# Patient Record
Sex: Female | Born: 1970
Health system: Southern US, Community
[De-identification: ages and names within clinical notes are randomized; demographics above are authoritative.]

## PROBLEM LIST (undated history)

## (undated) DIAGNOSIS — J301 Allergic rhinitis due to pollen: Secondary | ICD-10-CM

## (undated) DIAGNOSIS — T7840XA Allergy, unspecified, initial encounter: Secondary | ICD-10-CM

## (undated) DIAGNOSIS — B019 Varicella without complication: Secondary | ICD-10-CM

## (undated) DIAGNOSIS — E049 Nontoxic goiter, unspecified: Secondary | ICD-10-CM

## (undated) HISTORY — DX: Varicella without complication: B01.9

## (undated) HISTORY — DX: Nontoxic goiter, unspecified: E04.9

## (undated) HISTORY — DX: Allergic rhinitis due to pollen: J30.1

## (undated) HISTORY — DX: Allergy, unspecified, initial encounter: T78.40XA

---

## 1999-12-25 ENCOUNTER — Other Ambulatory Visit: Admission: RE | Admit: 1999-12-25 | Discharge: 1999-12-25 | Payer: Self-pay | Admitting: Obstetrics and Gynecology

## 2000-04-08 ENCOUNTER — Encounter: Payer: Self-pay | Admitting: Obstetrics and Gynecology

## 2000-04-08 ENCOUNTER — Ambulatory Visit (HOSPITAL_COMMUNITY): Admission: RE | Admit: 2000-04-08 | Discharge: 2000-04-08 | Payer: Self-pay | Admitting: Obstetrics and Gynecology

## 2000-08-12 ENCOUNTER — Inpatient Hospital Stay (HOSPITAL_COMMUNITY): Admission: AD | Admit: 2000-08-12 | Discharge: 2000-08-12 | Payer: Self-pay | Admitting: Obstetrics and Gynecology

## 2000-08-31 ENCOUNTER — Inpatient Hospital Stay (HOSPITAL_COMMUNITY): Admission: AD | Admit: 2000-08-31 | Discharge: 2000-09-02 | Payer: Self-pay | Admitting: Obstetrics and Gynecology

## 2000-08-31 ENCOUNTER — Encounter (INDEPENDENT_AMBULATORY_CARE_PROVIDER_SITE_OTHER): Payer: Self-pay | Admitting: Specialist

## 2001-01-04 ENCOUNTER — Other Ambulatory Visit: Admission: RE | Admit: 2001-01-04 | Discharge: 2001-01-04 | Payer: Self-pay | Admitting: Obstetrics and Gynecology

## 2001-06-11 ENCOUNTER — Encounter: Payer: Self-pay | Admitting: Emergency Medicine

## 2001-06-11 ENCOUNTER — Emergency Department (HOSPITAL_COMMUNITY): Admission: EM | Admit: 2001-06-11 | Discharge: 2001-06-11 | Payer: Self-pay | Admitting: Emergency Medicine

## 2001-08-09 ENCOUNTER — Other Ambulatory Visit: Admission: RE | Admit: 2001-08-09 | Discharge: 2001-08-09 | Payer: Self-pay | Admitting: Obstetrics and Gynecology

## 2001-09-16 ENCOUNTER — Other Ambulatory Visit: Admission: RE | Admit: 2001-09-16 | Discharge: 2001-09-16 | Payer: Self-pay | Admitting: Obstetrics and Gynecology

## 2001-12-17 ENCOUNTER — Other Ambulatory Visit: Admission: RE | Admit: 2001-12-17 | Discharge: 2001-12-17 | Payer: Self-pay | Admitting: Obstetrics and Gynecology

## 2003-03-06 ENCOUNTER — Other Ambulatory Visit: Admission: RE | Admit: 2003-03-06 | Discharge: 2003-03-06 | Payer: Self-pay | Admitting: Obstetrics and Gynecology

## 2003-08-30 ENCOUNTER — Inpatient Hospital Stay (HOSPITAL_COMMUNITY): Admission: AD | Admit: 2003-08-30 | Discharge: 2003-08-30 | Payer: Self-pay | Admitting: Obstetrics and Gynecology

## 2003-08-31 ENCOUNTER — Inpatient Hospital Stay (HOSPITAL_COMMUNITY): Admission: AD | Admit: 2003-08-31 | Discharge: 2003-09-03 | Payer: Self-pay | Admitting: Obstetrics and Gynecology

## 2003-09-04 ENCOUNTER — Encounter: Admission: RE | Admit: 2003-09-04 | Discharge: 2003-10-04 | Payer: Self-pay | Admitting: Obstetrics and Gynecology

## 2003-10-05 ENCOUNTER — Encounter: Admission: RE | Admit: 2003-10-05 | Discharge: 2003-11-04 | Payer: Self-pay | Admitting: Obstetrics and Gynecology

## 2004-12-19 ENCOUNTER — Other Ambulatory Visit: Admission: RE | Admit: 2004-12-19 | Discharge: 2004-12-19 | Payer: Self-pay | Admitting: Obstetrics and Gynecology

## 2005-12-24 ENCOUNTER — Other Ambulatory Visit: Admission: RE | Admit: 2005-12-24 | Discharge: 2005-12-24 | Payer: Self-pay | Admitting: Obstetrics and Gynecology

## 2007-03-10 ENCOUNTER — Encounter: Admission: RE | Admit: 2007-03-10 | Discharge: 2007-03-10 | Payer: Self-pay | Admitting: Obstetrics and Gynecology

## 2007-10-30 ENCOUNTER — Inpatient Hospital Stay (HOSPITAL_COMMUNITY): Admission: AD | Admit: 2007-10-30 | Discharge: 2007-10-30 | Payer: Self-pay | Admitting: Obstetrics and Gynecology

## 2008-04-08 ENCOUNTER — Inpatient Hospital Stay (HOSPITAL_COMMUNITY): Admission: AD | Admit: 2008-04-08 | Discharge: 2008-04-10 | Payer: Self-pay | Admitting: Obstetrics and Gynecology

## 2009-09-20 ENCOUNTER — Emergency Department (HOSPITAL_BASED_OUTPATIENT_CLINIC_OR_DEPARTMENT_OTHER): Admission: EM | Admit: 2009-09-20 | Discharge: 2009-09-20 | Payer: Self-pay | Admitting: Emergency Medicine

## 2010-09-10 NOTE — Discharge Summary (Signed)
NAME:  Sara Reynolds, Sara Reynolds             ACCOUNT NO.:  [id number]   MEDICAL RECORD NO.:  [id number]          PATIENT TYPE:  INP   LOCATION:  9147                          FACILITY:  [facility]   PHYSICIAN:  Malachi Pro. Ambrose Mantle, M.D. DATE OF BIRTH:  1970/09/03   DATE OF ADMISSION:  04/08/2008  DATE OF DISCHARGE:  04/10/2008                               DISCHARGE SUMMARY   A 40 year old white married female para 1-1-0-2, gravida 3, [facility] April 22, 2008, by dates admitted in early labor.  Blood group and type O  positive.  Negative antibody.  Nonreactive serology.  Rubella immune.  Hepatitis B surface antigen negative.  HIV negative.  GC and chlamydia  negative.  First trimester screen negative.  One-hour Glucola 107.  Group B strep positive.  Last period July 17, 2007.  [facility] April 22, 2008.  Vaginal ultrasound September 29, 2007.  Crown-rump length 3.3 cm, 10  weeks 1 day, [facility] April 22, 2008.  Ultrasound on November 29, 2007,  average gestational age [redacted] weeks 1 day, [facility] April 23, 2008.  The  patient had bleeding at 15 weeks'.  Normal ultrasound.  Prenatal care  otherwise uncomplicated.  She began contracting on the night of  admission.  Cervix was 3 cm on exam in the Maternity Admission Unit.  It  had been 2 cm in the office.   PAST MEDICAL HISTORY:  No known allergies.   OPERATION:  C-section in 2005.   ILLNESSES:  None.   ALCOHOL, TOBACCO, AND DRUGS:  None.   FAMILY HISTORY:  Father with high blood pressure and thyroid problem.  Mother with thyroid problem.  Paternal grandfather CVA.  Maternal  grandfather heart disease.  Paternal grandparents diabetes.   OBSTETRIC HISTORY:  In May 2002, she delivered a 6 pounds 13 ounces female  vaginally at 39 weeks.  In May 2005, a C-section for frank breech at 36+  weeks.   HOSPITAL COURSE:  On admission, her vital signs were normal.  Heart and  lungs were normal.  The abdomen was soft and term sized.  Fetal heart  tones were normal.  Cervix was 3-4  cm, 70% vertex at -2.  Artificial  rupture of membranes showed clear fluid with blood.  The patient was  begun on penicillin.  She wanted a vaginal birth after cesarean section.  She requested an epidural.  At 9:05 p.m., the cervix was 4-5 cm.  The  patient agreed to Pitocin after the epidural.  At 10 p.m., the cervix  was 4 cm, 90% vertex at -2.  Pitocin was begun on low-dose.  She became  fully dilated with the vertex ROA.  She pushed well and delivered  spontaneously ROA over a second-degree midline laceration, a living  female infant 7 pounds 2 ounces, Apgars of 9 at one and 9 at five  minutes.  Placenta was intact.  C-section scar was intact.  Rectal  negative.  Second-degree midline laceration repaired with 3-0 Vicryl.  Blood loss about 400 mL.  Postpartum, the patient did well and was  discharged on the second postpartum day.  Initial hemoglobin was  9.6,  hematocrit 29.8, white count 7600, platelet count 192,000.  RPR  nonreactive.  Followup hemoglobin 8.2.   FINAL DIAGNOSES:  Intrauterine pregnancy at 38 weeks delivered ROA,  positive group B strep, prior C-section.   OPERATION:  Spontaneous vaginal delivery, VBAC, second-degree midline  laceration repair.   FINAL CONDITION:  Improved.   Instructions include regular discharge instruction booklet.   Percocet 5/325, 30 tablets 1 every 4-6 hours as needed for pain, Motrin  600 mg 30 tablets 1 every 6 hours as needed for pain, take ferrous  sulfate 325 mg twice daily.   Return to the office in 6 weeks for followup examination.      Malachi Pro. Ambrose Mantle, M.D.  Electronically Signed     TFH/MEDQ  D:  04/10/2008  T:  04/10/2008  Job:  [id number]

## 2010-09-13 NOTE — Discharge Summary (Signed)
NAME:  Sara Reynolds, Sara Reynolds                       ACCOUNT NO.:  [id number]   MEDICAL RECORD NO.:  [id number]                   PATIENT TYPE:  INP   LOCATION:  9132                                 [facility]  [facility]   PHYSICIAN:  Zenaida Niece, M.D.             DATE OF BIRTH:  [birth year]   DATE OF ADMISSION:  08/31/2003  DATE OF DISCHARGE:  09/03/2003                                 DISCHARGE SUMMARY   ADMISSION DIAGNOSES:  Intrauterine pregnancy at 36 plus weeks, breech and  preterm labor.   DISCHARGE DIAGNOSES:  Intrauterine pregnancy at 36 plus weeks, breech and  preterm labor.   PROCEDURE:  On Aug 31, 2003 she had a primary low transverse cesarean section  without extensions.   HISTORY OF PRESENT ILLNESS:  Briefly, this is a 40 year old white female  gravida 2, para 1-0-0-1 with an estimated gestational age of 36+ weeks who  presented with irregular contractions. Prenatal care complicated by known  breech presentation and she had an attempted external cephalic version on  Aug 30, 2003.   PRENATAL LABORATORY DATA:  Blood type O+ with a negative antibody screen.  RPR nonreactive. Rubella immune. Hepatitis B surface antigen. Negative HIV.  Negative gonorrhea and Chlamydia. Negative group B strep is not on the  chart.   PAST OBSTETRIC HISTORY:  In 2002, vaginal delivery at term, 6 pounds 13  ounces without complications.   PHYSICAL EXAMINATION:  VITAL SIGNS:  Afebrile with stable vital signs.  ABDOMEN:  Gravid and nontender. Cervix is 5, complete, and zero with a  breech presentation.   HOSPITAL COURSE:  The patient was admitted and taken to the operating room  for a cesarean section. Dr. Senaida Ores performed the cesarean section under  spinal anesthesia with an estimated blood loss of 800 cc. This was a viable  female infant with Apgars of 7 and 9 that weighed 6 pounds 5 ounces and was in  the frank breech presentation. Postoperatively, she had no complications.  Pre-delivery  hemoglobin was 11.3. Post-delivery was 9.9. On the morning of  postoperative day 3, she was felt to be stable enough for discharge home. At  that time, her incision was healing well and staples were removed and steri-  strips applied. She is given our discharge pamphlet.   DIET:  Regular diet.   ACTIVITY:  Pelvic rest, no strenuous activity.   FOLLOW UP:  In approximately 2 weeks for incision check.   DISCHARGE MEDICATIONS:  1. Percocet #30 1 to 2 p.o. q. 4 to 6 hours p.r.n. pain.  2. Over-the-counter Ibuprofen as needed.                                              Zenaida Niece, M.D.   TDM/MEDQ  D:  09/03/2003  T:  09/03/2003  Job:  [id number]

## 2010-09-13 NOTE — Discharge Summary (Signed)
[facility]  Patient:    Sara Reynolds, Sara Reynolds                    MRN: [id number] Adm. Date:  [id number] Disc. Date: 09/02/00 Attending:  Malon Kindle                           Discharge Summary                                A 40 year old white married female para 0, gravida 1, last period Aug 29, 2000, [facility] Sep 05, 2000 by dates and Sep 03, 2000 by ultrasound admitted in active labor.  Blood group and type O+ with a negative antibody.  Nonreactive serology.  Rubella equivocal.  Hepatitis B surface antigen negative.  HIV negative.  GC and chlamydia negative.  Triple screen normal.  One hour glucola 121.  Group B strep negative.  Vaginal ultrasound on February 05, 2000:  Crown rump length 2.8 cm, 9 weeks 5 days, [facility] Sep 03, 2000.  Repeat ultrasound April 08, 2000:  Average gestational age [redacted] weeks 6 days, [facility] Sep 03, 2000.  The patient was placed on iron Heaven 10, 2002 for a hemoglobin of 10.  Prenatal course was otherwise uncomplicated.  Patient came to maternity admission unit at 8 cm dilatation and was fully dilated shortly after arrival in L&D, but declined to push.  ALLERGIES:                    No known drug allergies.  PAST SURGICAL HISTORY:        None.  PAST MEDICAL HISTORY:         Usual childhood diseases.  FAMILY HISTORY:               Father with high blood pressure, borderline diabetes.  Mother with thyroid dysfunction.  SOCIAL HISTORY:               Alcohol, tobacco, and drugs:  None.  PHYSICAL EXAMINATION  VITAL SIGNS:                  Normal.  ABDOMEN:                      Soft.  Fundal height had been 37 cm on Lovelle 24, 2002.  PELVIC:                       On my first examination the cervix was 10 cm, vertex at a +2 station.                                The patient brought the baby down well and delivered spontaneously OA over a midline episiotomy by Dr. Ambrose Mantle under local block a living female infant 6 pounds 13  ounces with Apgars of 8 at one and 9 at five minutes.  Episiotomy was done because there was fetal bradycardia into the 60s.  Placenta was intact.  Uterus normal.  Rectal was negative.  Midline episiotomy repaired with 3-0 Dexon.  Midline episiotomy appeared normal after repair.  Blood loss about 400 cc.  Postpartum the patient did quite well and was discharged on the second postpartum day.  Initial hemoglobin 10.5, hematocrit 32.1, white  count 9900, platelet count 261,000.  Followup hemoglobin 8.4, hematocrit 26.4, white count 9500, platelet count 197,000. RPR was nonreactive.  Rubella test done here result was 16.5.  Greater than 10 is immune so the patient is immune and does not need the rubella vaccine.  FINAL DIAGNOSES:              Intrauterine pregnancy at 39+ weeks, delivered occiput anterior.  OPERATION:                    Spontaneously delivery occiput anterior, episiotomy and repair.  FINAL CONDITION:              Improved.  INSTRUCTIONS:                 Regular discharge instruction booklet.  Patient is advised to return in six weeks.  DISCHARGE MEDICATIONS:        Ibuprofen 800 mg #20 one q.8h. p.r.n. pain. DD:  09/02/00 TD:  09/02/00 Job: [id number]

## 2010-09-13 NOTE — Op Note (Signed)
NAME:  Sara Reynolds, Sara Reynolds                       ACCOUNT NO.:  [id number]   MEDICAL RECORD NO.:  [id number]                   PATIENT TYPE:  INP   LOCATION:  9132                                 [facility]ILITY:  [facility]   PHYSICIAN:  Huel Cote, M.D.              DATE OF BIRTH:  1970/05/31   DATE OF PROCEDURE:  08/31/2003  DATE OF DISCHARGE:                                 OPERATIVE REPORT   PREOPERATIVE DIAGNOSES:  1. Preterm pregnancy at 36 plus weeks delivery.  2. Breech presentation.  3. Active labor.   POSTOPERATIVE DIAGNOSES:  1. Preterm pregnancy at 36 plus weeks delivery.  2. Breech presentation.  3. Active labor.   PROCEDURE:  Primary low transverse cesarean section.   SURGEON:  Huel Cote, M.D.   ANESTHESIA:  Spinal.   FINDINGS:  There is a vigorous female infant in the frank breech presentation,  Apgar 7 & 9, weight 6 pounds 5 ounces, placenta, ovaries and tubes were  normal.   URINARY OUTPUT:  100 mL for the surgery.   IV FLUIDS:  1100 mL.   ESTIMATED BLOOD LOSS:  800 mL.   DESCRIPTION OF PROCEDURE:  The patient was taken to the operating room where  spinal anesthesia was obtained without difficulty.  Her cervical exam prior  to being brought to the OR was 5 cm dilated.  She was prepped and draped in  the normal sterile fashion after adequate anesthesia was obtained in the  dorsal supine position with a leftward tilt.  With a Foley catheter in  place, a Pfannenstiel skin incision was then made in the skin with a scalpel  and carried through the underlying layer of fascia with Bovie cautery.  The  fascia then nicked in the midline and the incision was extended laterally  with Mayo scissors. The inferior aspect was grasped with Kocher clamps,  elevated and dissected off the underlying rectus muscle. The superior aspect  was elevated and dissected off the rectus muscles. The rectus muscles were  then separated in the midline and peritoneal cavity entered  bluntly. The  bladder blade was then inserted and the vesicouterine peritoneum identified  and the bladder flap created with Metzenbaum scissors.  The bladder blade  was then reinserted and the uterus incised in a transverse fashion. The  uterine cavity itself was entered bluntly. The infant sacrum was then  identified as the right sacrum anterior and the infant's body was delivered  atraumatically. The legs and arms reduced atraumatically and finally the  head delivered in a flex position without difficulty.  The infant's cord was  clamped and cut and handed off to the waiting pediatrician.  Cord blood was  obtained and the placenta was removed from the uterus manually. The uterus  was cleared of all clot and debris with a moist lap sponge. The uterine  incision was then repaired with #0 chromic in a running locked fashion in  two layers.  Good hemostasis at this point was noted. The abdomen and pelvis  were irrigated and cleared of all clots and debris. The tubes and ovaries  were inspected and found to be normal. The incision was once again inspected  and continued to be hemostatic. Therefore all instrument and sponges were  removed from the abdomen. The rectus muscles reapproximated with several  interrupted sutures of #0 Vicryl. The fascia was then closed with #0 Vicryl  in a running locked fashion. The subcutaneous tissue was reapproximated with  2-0 plain in a running suture and the skin was closed with staples.  Sponge,  lap and needle counts were correct x2 and the patient was taken to the  recovery room.                                               Huel Cote, M.D.    KR/MEDQ  D:  08/31/2003  T:  08/31/2003  Job:  [id number]

## 2011-01-23 LAB — ABO/RH: ABO/RH(D): O POS

## 2011-01-30 LAB — CBC
HCT: 25.6 % — ABNORMAL LOW (ref 36.0–46.0)
HCT: 29.8 % — ABNORMAL LOW (ref 36.0–46.0)
Hemoglobin: 8.2 g/dL — ABNORMAL LOW (ref 12.0–15.0)
Hemoglobin: 9.6 g/dL — ABNORMAL LOW (ref 12.0–15.0)
MCHC: 32.1 g/dL (ref 30.0–36.0)
MCHC: 32.1 g/dL (ref 30.0–36.0)
MCV: 69.3 fL — ABNORMAL LOW (ref 78.0–100.0)
MCV: 69.9 fL — ABNORMAL LOW (ref 78.0–100.0)
Platelets: 181 10*3/uL (ref 150–400)
Platelets: 192 10*3/uL (ref 150–400)
RBC: 3.7 MIL/uL — ABNORMAL LOW (ref 3.87–5.11)
RBC: 4.26 MIL/uL (ref 3.87–5.11)
RDW: 16.1 % — ABNORMAL HIGH (ref 11.5–15.5)
RDW: 16.3 % — ABNORMAL HIGH (ref 11.5–15.5)
WBC: 10.6 10*3/uL — ABNORMAL HIGH (ref 4.0–10.5)
WBC: 7.4 10*3/uL (ref 4.0–10.5)

## 2011-01-30 LAB — RPR: RPR Ser Ql: NONREACTIVE

## 2012-08-26 ENCOUNTER — Ambulatory Visit (INDEPENDENT_AMBULATORY_CARE_PROVIDER_SITE_OTHER): Payer: BC Managed Care – PPO | Admitting: Family Medicine

## 2012-08-26 ENCOUNTER — Encounter: Payer: Self-pay | Admitting: Family Medicine

## 2012-08-26 VITALS — BP 120/80 | HR 68 | Temp 98.0°F | Ht 64.0 in | Wt 169.6 lb

## 2012-08-26 DIAGNOSIS — Z0000 Encounter for general adult medical examination without abnormal findings: Secondary | ICD-10-CM

## 2012-08-26 DIAGNOSIS — Z1331 Encounter for screening for depression: Secondary | ICD-10-CM

## 2012-08-26 DIAGNOSIS — Z1231 Encounter for screening mammogram for malignant neoplasm of breast: Secondary | ICD-10-CM

## 2012-08-26 LAB — LIPID PANEL
Cholesterol: 175 mg/dL (ref 0–200)
HDL: 59.6 mg/dL (ref >39.00)
LDL Cholesterol: 105 mg/dL — ABNORMAL HIGH (ref 0–99)
Total CHOL/HDL Ratio: 3
Triglycerides: 50 mg/dL (ref 0.0–149.0)
VLDL: 10 mg/dL (ref 0.0–40.0)

## 2012-08-26 LAB — BASIC METABOLIC PANEL
BUN: 11 mg/dL (ref 6–23)
CO2: 23 mEq/L (ref 19–32)
Calcium: 8.9 mg/dL (ref 8.4–10.5)
Chloride: 106 mEq/L (ref 96–112)
Creatinine, Ser: 0.9 mg/dL (ref 0.4–1.2)
GFR: 77.28 mL/min (ref >60.00)
Glucose, Bld: 89 mg/dL (ref 70–99)
Potassium: 4.4 mEq/L (ref 3.5–5.1)
Sodium: 134 mEq/L — ABNORMAL LOW (ref 135–145)

## 2012-08-26 LAB — TSH: TSH: 0.67 u[IU]/mL (ref 0.35–5.50)

## 2012-08-26 LAB — HEPATIC FUNCTION PANEL
ALT: 12 U/L (ref 0–35)
AST: 26 U/L (ref 0–37)
Albumin: 4.1 g/dL (ref 3.5–5.2)
Alkaline Phosphatase: 61 U/L (ref 39–117)
Bilirubin, Direct: 0.3 mg/dL (ref 0.0–0.3)
Total Bilirubin: 0.6 mg/dL (ref 0.3–1.2)
Total Protein: 7.4 g/dL (ref 6.0–8.3)

## 2012-08-26 NOTE — Progress Notes (Signed)
  Subjective:    Patient ID: Sara Reynolds, female    DOB: 1970/11/08, 42 y.o.   MRN: [id number]  HPI New to establish.  Due for mammo.  UTD on pap.  Previous MD- [facility].  GYNSenaida Ores.   Review of Systems Patient reports no vision/ hearing changes, adenopathy,fever, weight change,  persistant/recurrent hoarseness , swallowing issues, chest pain, palpitations, edema, persistant/recurrent cough, hemoptysis, dyspnea (rest/exertional/paroxysmal nocturnal), gastrointestinal bleeding (melena, rectal bleeding), abdominal pain, significant heartburn, bowel changes, Gyn symptoms (abnormal  bleeding, pain),  syncope, focal weakness, memory loss, numbness & tingling, skin/hair/nail changes, abnormal bruising or bleeding, anxiety, or depression.   + slight urinary leakage- wearing pantyliners.   + fatigue, weight gain (20 lbs this year), cold intolerance, dry skin, constipation    Objective:   Physical Exam General Appearance:    Alert, cooperative, no distress, appears stated age  Head:    Normocephalic, without obvious abnormality, atraumatic  Eyes:    PERRL, conjunctiva/corneas clear, EOM's intact, fundi    benign, both eyes  Ears:    Normal TM's and external ear canals, both ears  Nose:   Nares normal, septum midline, mucosa normal, no drainage    or sinus tenderness  Throat:   Lips, mucosa, and tongue normal; teeth and gums normal  Neck:   Supple, symmetrical, trachea midline, no adenopathy;    Thyroid: diffuse enlargement, no nodularity  Back:     Symmetric, no curvature, ROM normal, no CVA tenderness  Lungs:     Clear to auscultation bilaterally, respirations unlabored  Chest Wall:    No tenderness or deformity   Heart:    Regular rate and rhythm, S1 and S2 normal, no murmur, rub   or gallop  Breast Exam:    Deferred to GYN  Abdomen:     Soft, non-tender, bowel sounds active all four quadrants,    no masses, no organomegaly  Genitalia:    Deferred to GYN  Rectal:     Extremities:   Extremities normal, atraumatic, no cyanosis or edema  Pulses:   2+ and symmetric all extremities  Skin:   Skin color, texture, turgor normal, no rashes or lesions  Lymph nodes:   Cervical, supraclavicular, and axillary nodes normal  Neurologic:   CNII-XII intact, normal strength, sensation and reflexes    throughout          Assessment & Plan:

## 2012-08-26 NOTE — Assessment & Plan Note (Signed)
Pt's PE WNL w/ exception of thyromegaly.  Her sxs are suggestive of hypothyroid.  UTD on pap, needs mammo.  Order entered.  Check labs.  Anticipatory guidance provided.

## 2012-08-26 NOTE — Patient Instructions (Addendum)
We'll notify you of your lab results and make any changes if needed Keep up the good work on the [employer].  Try and add regular activity We'll call you with your mammo appt Call with any questions or concerns Happy [place]ated Birthday! Welcome!  We're glad to have you!!!

## 2012-08-27 ENCOUNTER — Telehealth: Payer: Self-pay | Admitting: *Deleted

## 2012-08-27 DIAGNOSIS — Z0000 Encounter for general adult medical examination without abnormal findings: Secondary | ICD-10-CM

## 2012-08-27 NOTE — Telephone Encounter (Signed)
Called pt and informed her that some of her blood that was drawn on yest clotted and we will need to redraw it.  Pt understood and agreed.    Where on hold pt was disconnected.   Called pt back and LM asking the pt to call back to schedule an lab appt.  Lab order entered and sent.//AB/CMA

## 2012-08-30 ENCOUNTER — Other Ambulatory Visit (INDEPENDENT_AMBULATORY_CARE_PROVIDER_SITE_OTHER): Payer: BC Managed Care – PPO

## 2012-08-30 DIAGNOSIS — Z0000 Encounter for general adult medical examination without abnormal findings: Secondary | ICD-10-CM

## 2012-08-30 LAB — VITAMIN D 1,25 DIHYDROXY
Vitamin D 1, 25 (OH)2 Total: 78 pg/mL — ABNORMAL HIGH (ref 18–72)
Vitamin D2 1, 25 (OH)2: <8 pg/mL
Vitamin D3 1, 25 (OH)2: 78 pg/mL

## 2012-08-31 LAB — CBC WITH DIFFERENTIAL/PLATELET
Basophils Absolute: 0 10*3/uL (ref 0.0–0.1)
Basophils Relative: 0.9 % (ref 0.0–3.0)
Eosinophils Absolute: 0 10*3/uL (ref 0.0–0.7)
Eosinophils Relative: 0.3 % (ref 0.0–5.0)
HCT: 39.8 % (ref 36.0–46.0)
Hemoglobin: 13.3 g/dL (ref 12.0–15.0)
Lymphocytes Relative: 30 % (ref 12.0–46.0)
Lymphs Abs: 1.2 10*3/uL (ref 0.7–4.0)
MCHC: 33.5 g/dL (ref 30.0–36.0)
MCV: 79.8 fl (ref 78.0–100.0)
Monocytes Absolute: 0.4 10*3/uL (ref 0.1–1.0)
Monocytes Relative: 9.4 % (ref 3.0–12.0)
Neutro Abs: 2.4 10*3/uL (ref 1.4–7.7)
Neutrophils Relative %: 59.4 % (ref 43.0–77.0)
Platelets: 190 10*3/uL (ref 150.0–400.0)
RBC: 4.98 Mil/uL (ref 3.87–5.11)
RDW: 15.7 % — ABNORMAL HIGH (ref 11.5–14.6)
WBC: 4.1 10*3/uL — ABNORMAL LOW (ref 4.5–10.5)

## 2012-09-03 ENCOUNTER — Encounter: Payer: Self-pay | Admitting: General Practice

## 2012-10-04 ENCOUNTER — Ambulatory Visit
Admission: RE | Admit: 2012-10-04 | Discharge: 2012-10-04 | Disposition: A | Discharge status: Home / Self Care | Payer: BC Managed Care – PPO | Source: Ambulatory Visit | Attending: Family Medicine | Admitting: Family Medicine

## 2012-10-04 DIAGNOSIS — Z1231 Encounter for screening mammogram for malignant neoplasm of breast: Secondary | ICD-10-CM

## 2012-12-09 ENCOUNTER — Encounter: Payer: Self-pay | Admitting: Family Medicine

## 2012-12-09 ENCOUNTER — Ambulatory Visit (INDEPENDENT_AMBULATORY_CARE_PROVIDER_SITE_OTHER): Payer: BC Managed Care – PPO | Admitting: Family Medicine

## 2012-12-09 VITALS — BP 122/84 | HR 81 | Temp 98.5°F | Ht 64.0 in | Wt 161.2 lb

## 2012-12-09 DIAGNOSIS — F3289 Other specified depressive episodes: Secondary | ICD-10-CM

## 2012-12-09 DIAGNOSIS — F32A Depression, unspecified: Secondary | ICD-10-CM | POA: Insufficient documentation

## 2012-12-09 DIAGNOSIS — F329 Major depressive disorder, single episode, unspecified: Secondary | ICD-10-CM

## 2012-12-09 MED ORDER — VENLAFAXINE HCL ER 37.5 MG PO CP24: 37.5000 mg | ORAL_CAPSULE | Freq: Every day | ORAL | Status: DC

## 2012-12-09 NOTE — Assessment & Plan Note (Signed)
New.  Severe.  Start meds.  Follow closely.  Encouraged counseling.  Names and #s given.  Pt encouraged to call if needed.  Pt able to contract for safety today.  Pt expressed understanding and is in agreement w/ plan.

## 2012-12-09 NOTE — Patient Instructions (Addendum)
Follow up in 1 month to recheck mood Start the Effexor daily in the AM When you are ready, consider daily exercise You may want to consider counseling Call with any questions or concerns You are not alone!  You can do this!

## 2012-12-09 NOTE — Progress Notes (Signed)
  Subjective:    Patient ID: Sara Reynolds, female    DOB: March 31, 1971, 42 y.o.   MRN: [id number]  HPI Depressed- sister came to visit from [place] and pt realized that she is withdrawing from people and things that she used to enjoy.  'i feel so disconnected from everyone'.  Had a 'great week w/ my sister' but afterwards 'i feel so down'.  Very tearful.  Has had similar episodes previously but 'i've always been able to get over this'.  Pt has never been on meds.  Sister is bipolar, mom is 'extreme- but has never been diagnosed'.  Has never had any sxs of mania or hypomania.  No SI/HI.  No interest in sex- fears sexual side effects.  Also fears weight gain.  Decreased energy, decreased motivation.   Review of Systems For ROS see HPI     Objective:   Physical Exam  Vitals reviewed. Constitutional: She is oriented to person, place, and time. She appears well-developed and well-nourished. She appears distressed (tearful, anxious).  Neurological: She is alert and oriented to person, place, and time.  Psychiatric:  Tearful, withdrawn- clearly upset          Assessment & Plan:

## 2012-12-13 ENCOUNTER — Telehealth: Payer: Self-pay

## 2012-12-13 MED ORDER — BUPROPION HCL ER (XL) 150 MG PO TB24: 150.0000 mg | ORAL_TABLET | Freq: Every day | ORAL | Status: DC

## 2012-12-13 NOTE — Telephone Encounter (Signed)
Pt notified of the change and med filled to [facility].

## 2012-12-13 NOTE — Telephone Encounter (Signed)
Called pt and gave her Tabori's message. Pt stated that she does not want to go onto Prozac. Would like to know if Wellbutrin is an option? If not she will stay with the Effexor.

## 2012-12-13 NOTE — Telephone Encounter (Signed)
Wellbutrin is an option- ok for XR 150mg  daily, #30, 3 refills

## 2012-12-13 NOTE — Telephone Encounter (Signed)
Message left on triage voicemail: patient would like a call to discuss recent OV/medication  Called patient, patient indicated that she takes Effexor about 9:30 am daily and about 2-3 pm she crashes, patient gets extremely fatigue, can hardly stay awake. Patient was also told by a friend that this medication is hard to come off of. Patient is concerned that in the future she may have problems with withdrawals when trying to d/c this medication.   Patient would like to know if the symptoms that she experiences (crashing-like symptoms) will resolve or does that mean this medication is not agreeing with her? Patient would also like Dr.Tabori's expert advice/ experience with d/c'ing patients off this medication

## 2012-12-13 NOTE — Telephone Encounter (Signed)
This med can be hard to come off of, but not more so than the other depression/anxiety meds.  It's hard to say whether the crashing will improve- typically any sxs that persist past 1 week will not get better.  We can switch to Prozac 20mg  daily and see if she has better results.

## 2013-01-11 ENCOUNTER — Ambulatory Visit: Payer: BC Managed Care – PPO | Admitting: Family Medicine

## 2014-11-03 ENCOUNTER — Ambulatory Visit (HOSPITAL_BASED_OUTPATIENT_CLINIC_OR_DEPARTMENT_OTHER)
Admission: RE | Admit: 2014-11-03 | Discharge: 2014-11-03 | Disposition: A | Discharge status: Home / Self Care | Payer: 59 | Source: Ambulatory Visit | Attending: Family Medicine | Admitting: Family Medicine

## 2014-11-03 ENCOUNTER — Encounter: Payer: Self-pay | Admitting: Family Medicine

## 2014-11-03 ENCOUNTER — Ambulatory Visit (INDEPENDENT_AMBULATORY_CARE_PROVIDER_SITE_OTHER): Payer: 59 | Admitting: Family Medicine

## 2014-11-03 VITALS — BP 120/82 | HR 79 | Temp 98.1°F | Resp 16 | Ht 64.0 in | Wt 162.1 lb

## 2014-11-03 DIAGNOSIS — F32A Depression, unspecified: Secondary | ICD-10-CM

## 2014-11-03 DIAGNOSIS — F329 Major depressive disorder, single episode, unspecified: Secondary | ICD-10-CM | POA: Diagnosis not present

## 2014-11-03 DIAGNOSIS — R222 Localized swelling, mass and lump, trunk: Secondary | ICD-10-CM | POA: Diagnosis present

## 2014-11-03 DIAGNOSIS — M7989 Other specified soft tissue disorders: Secondary | ICD-10-CM | POA: Insufficient documentation

## 2014-11-03 DIAGNOSIS — M799 Soft tissue disorder, unspecified: Secondary | ICD-10-CM | POA: Diagnosis not present

## 2014-11-03 NOTE — Progress Notes (Signed)
   Subjective:    Patient ID: Sara Reynolds, female    DOB: 15-Jun-1970, 44 y.o.   MRN: [id number]  HPI Depression- pt reports depression will 'ebb and flow'.  Working full time, has 3 kids.  Not taking Wellbutrin.  Walking regularly, attempting exercise.  'sometimes it's good and sometimes it's not'.  Good communication w/ husband, pt is trying to take more time for herself.  Lump on R flank- pt reports this has been present for 'years'.  Palpable.  Had to change bras due to increasing pain.  Review of Systems For ROS see HPI     Objective:   Physical Exam  Constitutional: She is oriented to person, place, and time. She appears well-developed and well-nourished. No distress.  HENT:  Head: Normocephalic and atraumatic.  Eyes: Conjunctivae and EOM are normal. Pupils are equal, round, and reactive to light.  Neck: Normal range of motion. Neck supple. Thyromegaly (known goiter) present.  Cardiovascular: Normal rate, regular rhythm, normal heart sounds and intact distal pulses.   No murmur heard. Pulmonary/Chest: Effort normal and breath sounds normal. No respiratory distress. She exhibits tenderness (soft tissue mass of R flank, ~1 cm rubbery freely mobile mass).  Abdominal: Soft. She exhibits no distension. There is no tenderness.  Musculoskeletal: She exhibits no edema.  Lymphadenopathy:    She has no cervical adenopathy.  Neurological: She is alert and oriented to person, place, and time.  Skin: Skin is warm and dry.  Psychiatric: She has a normal mood and affect. Her behavior is normal.  Vitals reviewed.         Assessment & Plan:

## 2014-11-03 NOTE — Progress Notes (Signed)
Pre visit review using our clinic review tool, if applicable. No additional management support is needed unless otherwise documented below in the visit note. 

## 2014-11-03 NOTE — Patient Instructions (Signed)
Schedule your complete physical in 6 months We'll notify you of your ultrasound results Keep up the good work!  You look great! Call with any questions or concerns Have a great summer!!

## 2014-11-05 NOTE — Assessment & Plan Note (Signed)
New.  Pt w/ small, freely mobile soft tissue mass in R flank.  Unclear if this is lipoma, cystic, or other soft tissue mass.  Will get [place] to assess and determine the next step.  Pt expressed understanding and is in agreement w/ plan.

## 2014-11-05 NOTE — Assessment & Plan Note (Signed)
Ongoing issue for pt.  She is handling things much better than at time of last visit.  Not currently on meds.  No need to start at this time.  Will continue to follow.

## 2015-02-21 ENCOUNTER — Ambulatory Visit: Payer: 59 | Admitting: Family Medicine

## 2015-02-28 ENCOUNTER — Telehealth: Payer: Self-pay | Admitting: Family Medicine

## 2015-02-28 NOTE — Telephone Encounter (Signed)
Pt was no show 02/21/15 8:45am, acute appt, pt did not reschedule, charge or no charge?

## 2015-03-01 NOTE — Telephone Encounter (Signed)
Pt needs a no show fee

## 2015-05-11 ENCOUNTER — Encounter: Payer: 59 | Admitting: Family Medicine

## 2015-05-14 ENCOUNTER — Other Ambulatory Visit (HOSPITAL_COMMUNITY)
Admission: RE | Admit: 2015-05-14 | Discharge: 2015-05-14 | Disposition: A | Discharge status: Home / Self Care | Payer: BLUE CROSS/BLUE SHIELD | Source: Ambulatory Visit | Attending: Family Medicine | Admitting: Family Medicine

## 2015-05-14 ENCOUNTER — Encounter: Payer: Self-pay | Admitting: Family Medicine

## 2015-05-14 ENCOUNTER — Other Ambulatory Visit: Payer: Self-pay

## 2015-05-14 ENCOUNTER — Other Ambulatory Visit: Payer: Self-pay | Admitting: Family Medicine

## 2015-05-14 ENCOUNTER — Ambulatory Visit (INDEPENDENT_AMBULATORY_CARE_PROVIDER_SITE_OTHER): Payer: BLUE CROSS/BLUE SHIELD | Admitting: Family Medicine

## 2015-05-14 VITALS — BP 118/72 | HR 83 | Temp 98.1°F | Ht 64.0 in | Wt 170.2 lb

## 2015-05-14 DIAGNOSIS — Z0001 Encounter for general adult medical examination with abnormal findings: Secondary | ICD-10-CM | POA: Diagnosis not present

## 2015-05-14 DIAGNOSIS — Z0000 Encounter for general adult medical examination without abnormal findings: Secondary | ICD-10-CM | POA: Diagnosis not present

## 2015-05-14 DIAGNOSIS — Z01419 Encounter for gynecological examination (general) (routine) without abnormal findings: Secondary | ICD-10-CM | POA: Diagnosis present

## 2015-05-14 DIAGNOSIS — N63 Unspecified lump in breast: Secondary | ICD-10-CM | POA: Diagnosis not present

## 2015-05-14 DIAGNOSIS — Z124 Encounter for screening for malignant neoplasm of cervix: Secondary | ICD-10-CM | POA: Insufficient documentation

## 2015-05-14 DIAGNOSIS — N6310 Unspecified lump in the right breast, unspecified quadrant: Secondary | ICD-10-CM | POA: Insufficient documentation

## 2015-05-14 LAB — LIPID PANEL
Cholesterol: 159 mg/dL (ref 0–200)
HDL: 59.3 mg/dL (ref >39.00)
LDL Cholesterol: 84 mg/dL (ref 0–99)
NonHDL: 100.14
Total CHOL/HDL Ratio: 3
Triglycerides: 81 mg/dL (ref 0.0–149.0)
VLDL: 16.2 mg/dL (ref 0.0–40.0)

## 2015-05-14 LAB — BASIC METABOLIC PANEL
BUN: 12 mg/dL (ref 6–23)
CO2: 20 mEq/L (ref 19–32)
Calcium: 8.9 mg/dL (ref 8.4–10.5)
Chloride: 106 mEq/L (ref 96–112)
Creatinine, Ser: 0.75 mg/dL (ref 0.40–1.20)
GFR: 89.33 mL/min (ref >60.00)
Glucose, Bld: 76 mg/dL (ref 70–99)
Potassium: 4 mEq/L (ref 3.5–5.1)
Sodium: 139 mEq/L (ref 135–145)

## 2015-05-14 LAB — CBC WITH DIFFERENTIAL/PLATELET
Basophils Absolute: 0 10*3/uL (ref 0.0–0.1)
Basophils Relative: 0.4 % (ref 0.0–3.0)
Eosinophils Absolute: 0 10*3/uL (ref 0.0–0.7)
Eosinophils Relative: 0.2 % (ref 0.0–5.0)
HCT: 41.4 % (ref 36.0–46.0)
Hemoglobin: 13.7 g/dL (ref 12.0–15.0)
Lymphocytes Relative: 23.1 % (ref 12.0–46.0)
Lymphs Abs: 0.9 10*3/uL (ref 0.7–4.0)
MCHC: 33.1 g/dL (ref 30.0–36.0)
MCV: 81.2 fl (ref 78.0–100.0)
Monocytes Absolute: 0.3 10*3/uL (ref 0.1–1.0)
Monocytes Relative: 7.7 % (ref 3.0–12.0)
Neutro Abs: 2.8 10*3/uL (ref 1.4–7.7)
Neutrophils Relative %: 68.6 % (ref 43.0–77.0)
Platelets: 215 10*3/uL (ref 150.0–400.0)
RBC: 5.1 Mil/uL (ref 3.87–5.11)
RDW: 14.3 % (ref 11.5–15.5)
WBC: 4.1 10*3/uL (ref 4.0–10.5)

## 2015-05-14 LAB — VITAMIN D 25 HYDROXY (VIT D DEFICIENCY, FRACTURES): VITD: 20.67 ng/mL — ABNORMAL LOW (ref 30.00–100.00)

## 2015-05-14 LAB — HEPATIC FUNCTION PANEL
ALT: 9 U/L (ref 0–35)
AST: 14 U/L (ref 0–37)
Albumin: 4.1 g/dL (ref 3.5–5.2)
Alkaline Phosphatase: 59 U/L (ref 39–117)
Bilirubin, Direct: 0.1 mg/dL (ref 0.0–0.3)
Total Bilirubin: 0.4 mg/dL (ref 0.2–1.2)
Total Protein: 7.4 g/dL (ref 6.0–8.3)

## 2015-05-14 LAB — TSH: TSH: 1.29 u[IU]/mL (ref 0.35–4.50)

## 2015-05-14 NOTE — Progress Notes (Signed)
Pre visit review using our clinic review tool, if applicable. No additional management support is needed unless otherwise documented below in the visit note. 

## 2015-05-14 NOTE — Assessment & Plan Note (Signed)
Ongoing issue for pt.  Not palpable today but uncomfortable for pt on exam.  Location of pt's concern is in tail of R breast just posterior to mid-axillary line.  Will get diagnostic mammo to assess.

## 2015-05-14 NOTE — Patient Instructions (Signed)
Follow up in 1 year- sooner if needed We'll notify you of your lab results and make any changes if needed Keep up the good work on healthy diet and regular exercise- you look great! We'll call you with your mammogram appt Call with any questions or concerns If you want to join [place] at the new [place] office, any scheduled appointments will automatically transfer and we will see you at [address] [place] [address], [place], [place] [address] ([place]) [place]!!!

## 2015-05-14 NOTE — Progress Notes (Signed)
   Subjective:    Patient ID: Sara Reynolds, female    DOB: 1970/11/08, 45 y.o.   MRN: [id number]  HPI CPE- due for pap and mammo [facility]).  Too young for colonoscopy.   Review of Systems Patient reports no vision/ hearing changes, adenopathy,fever, weight change,  persistant/recurrent hoarseness , swallowing issues, chest pain, palpitations, edema, persistant/recurrent cough, hemoptysis, dyspnea (rest/exertional/paroxysmal nocturnal), gastrointestinal bleeding (melena, rectal bleeding), abdominal pain, significant heartburn, bowel changes, GU symptoms (dysuria, hematuria, incontinence),  syncope, focal weakness, memory loss, numbness & tingling, skin/hair/nail changes, abnormal bruising or bleeding, anxiety, or depression.   + menstrual irregularity w/ HAs    Objective:   Physical Exam  General Appearance:    Alert, cooperative, no distress, appears stated age  Head:    Normocephalic, without obvious abnormality, atraumatic  Eyes:    PERRL, conjunctiva/corneas clear, EOM's intact, fundi    benign, both eyes  Ears:    Normal TM's and external ear canals, both ears  Nose:   Nares normal, septum midline, mucosa normal, no drainage    or sinus tenderness  Throat:   Lips, mucosa, and tongue normal; teeth and gums normal  Neck:   Supple, symmetrical, trachea midline, no adenopathy;    Thyroid: no enlargement/tenderness/nodules  Back:     Symmetric, no curvature, ROM normal, no CVA tenderness  Lungs:     Clear to auscultation bilaterally, respirations unlabored  Chest Wall:    No tenderness or deformity   Heart:    Regular rate and rhythm, S1 and S2 normal, no murmur, rub   or gallop  Breast Exam:    No tenderness, masses, or nipple abnormality.  Pt reports tenderness w/ 'lump' just posterior to mid axillary line in tail of R breast  Abdomen:     Soft, non-tender, bowel sounds active all four quadrants,    no masses, no organomegaly  Genitalia:    External genitalia normal,  cervix normal in appearance, no CMT, uterus in normal size and position, adnexa w/out mass or tenderness, mucosa pink and moist, no lesions or discharge present  Rectal:    Normal external appearance  Extremities:   Extremities normal, atraumatic, no cyanosis or edema  Pulses:   2+ and symmetric all extremities  Skin:   Skin color, texture, turgor normal, no rashes or lesions  Lymph nodes:   Cervical, supraclavicular, and axillary nodes normal  Neurologic:   CNII-XII intact, normal strength, sensation and reflexes    throughout          Assessment & Plan:

## 2015-05-15 ENCOUNTER — Other Ambulatory Visit: Payer: Self-pay | Admitting: Family Medicine

## 2015-05-15 LAB — CYTOLOGY - PAP

## 2015-05-15 MED ORDER — VITAMIN D (ERGOCALCIFEROL) 1.25 MG (50000 UNIT) PO CAPS: 50000.0000 [IU] | ORAL_CAPSULE | ORAL | Status: DC

## 2015-05-20 NOTE — Assessment & Plan Note (Signed)
Pt's PE WNL w/ exception of R mid-axillar line soft tissue mass.  Get imaging to assess.  Pap done today.  Check labs.  Anticipatory guidance provided.

## 2015-05-20 NOTE — Assessment & Plan Note (Signed)
Pap collected.

## 2015-06-18 ENCOUNTER — Other Ambulatory Visit: Payer: BLUE CROSS/BLUE SHIELD

## 2015-07-02 ENCOUNTER — Ambulatory Visit
Admission: RE | Admit: 2015-07-02 | Discharge: 2015-07-02 | Disposition: A | Discharge status: Home / Self Care | Payer: BLUE CROSS/BLUE SHIELD | Source: Ambulatory Visit | Attending: Family Medicine | Admitting: Family Medicine

## 2015-07-02 ENCOUNTER — Other Ambulatory Visit: Payer: Self-pay | Admitting: Family Medicine

## 2015-07-02 DIAGNOSIS — N6310 Unspecified lump in the right breast, unspecified quadrant: Secondary | ICD-10-CM

## 2015-07-09 ENCOUNTER — Ambulatory Visit (INDEPENDENT_AMBULATORY_CARE_PROVIDER_SITE_OTHER): Payer: BLUE CROSS/BLUE SHIELD | Admitting: Family Medicine

## 2015-07-09 ENCOUNTER — Encounter: Payer: Self-pay | Admitting: Family Medicine

## 2015-07-09 VITALS — BP 120/78 | HR 97 | Temp 99.0°F | Resp 17 | Wt 167.1 lb

## 2015-07-09 DIAGNOSIS — R319 Hematuria, unspecified: Secondary | ICD-10-CM | POA: Diagnosis not present

## 2015-07-09 DIAGNOSIS — N390 Urinary tract infection, site not specified: Secondary | ICD-10-CM | POA: Insufficient documentation

## 2015-07-09 DIAGNOSIS — R509 Fever, unspecified: Secondary | ICD-10-CM

## 2015-07-09 DIAGNOSIS — N3000 Acute cystitis without hematuria: Secondary | ICD-10-CM

## 2015-07-09 DIAGNOSIS — J101 Influenza due to other identified influenza virus with other respiratory manifestations: Secondary | ICD-10-CM | POA: Diagnosis not present

## 2015-07-09 DIAGNOSIS — R300 Dysuria: Secondary | ICD-10-CM

## 2015-07-09 LAB — POC URINALSYSI DIPSTICK (AUTOMATED)
Bilirubin, UA: NEGATIVE
Glucose, UA: NEGATIVE
Ketones, UA: NEGATIVE
Leukocytes, UA: NEGATIVE
Nitrite, UA: NEGATIVE
Spec Grav, UA: >=1.03
Urobilinogen, UA: 0.2
pH, UA: 6

## 2015-07-09 LAB — POCT INFLUENZA A/B
Influenza A, POC: NEGATIVE
Influenza B, POC: POSITIVE — AB

## 2015-07-09 MED ORDER — OSELTAMIVIR PHOSPHATE 75 MG PO CAPS: 75.0000 mg | ORAL_CAPSULE | Freq: Two times a day (BID) | ORAL | Status: DC

## 2015-07-09 MED ORDER — CEPHALEXIN 500 MG PO CAPS: 500.0000 mg | ORAL_CAPSULE | Freq: Two times a day (BID) | ORAL | Status: AC

## 2015-07-09 NOTE — Progress Notes (Signed)
   Subjective:    Patient ID: Sara Reynolds, female    DOB: [birth year], 45 y.o.   MRN: [id number]  HPI Fever- sxs started this weekend w/ fever, cough, nasal congestion. Tm 100 early this AM.  Took tylenol and ibuprofen.  Denies chest congestion.  + body aches this weekend but this has improved.  Cough is dry.  Nasal drainage is green.  Bilateral ear fullness- R ear pain yesterday but not today.  Mild TTP over sinuses.  No dizziness, SOB, CP.  + sick contacts.  Dysuria- pt reports sxs started as incontinence w/ coughing and progressed to dysuria last night.  + frequency/urgency.  No suprapubic tenderness.  No CVA tenderness.   Review of Systems For ROS see HPI     Objective:   Physical Exam  Constitutional: She appears well-developed and well-nourished. No distress.  Obviously not feeling well  HENT:  Head: Normocephalic and atraumatic.  Right Ear: Tympanic membrane normal.  Left Ear: Tympanic membrane normal.  Nose: Mucosal edema and rhinorrhea present. Right sinus exhibits no maxillary sinus tenderness and no frontal sinus tenderness. Left sinus exhibits no maxillary sinus tenderness and no frontal sinus tenderness.  Mouth/Throat: Uvula is midline and mucous membranes are normal. Posterior oropharyngeal erythema present. No oropharyngeal exudate.  Eyes: Conjunctivae and EOM are normal. Pupils are equal, round, and reactive to light.  Neck: Normal range of motion. Neck supple.  Cardiovascular: Normal rate, regular rhythm and normal heart sounds.   Pulmonary/Chest: Effort normal and breath sounds normal. No respiratory distress. She has no wheezes.  + dry cough  Lymphadenopathy:    She has no cervical adenopathy.  Skin: Skin is warm.  Vitals reviewed.         Assessment & Plan:

## 2015-07-09 NOTE — Patient Instructions (Signed)
Follow up as needed Start the Keflex twice daily for the UTI Drink plenty of fluids You do have Flu B- start the Tamiflu twice daily REST!!! Alternate tylenol/ibuprofen every 4 hrs for pain/fever Call with any questions or concerns If you want to join [place] at the new [place] office, any scheduled appointments will automatically transfer and we will see you at [address] [place] [address] [place], [place] [address] ([place] 3/23) Martorell in there!!!

## 2015-07-09 NOTE — Assessment & Plan Note (Signed)
New.  Pt's sxs and lack of bacterial infxn on PE consistent w/ flu.  Rapid test confirms in office.  Start Tamiflu.  Reviewed supportive care and red flags that should prompt return.  Pt expressed understanding and is in agreement w/ plan.

## 2015-07-09 NOTE — Progress Notes (Signed)
Pre visit review using our clinic review tool, if applicable. No additional management support is needed unless otherwise documented below in the visit note. 

## 2015-07-09 NOTE — Assessment & Plan Note (Signed)
Pt's sxs and UA consistent w/ infxn.  Start abx.  Reviewed supportive care and red flags that should prompt return.  Pt expressed understanding and is in agreement w/ plan.

## 2015-07-09 NOTE — Addendum Note (Signed)
Addended by: Caffie Pinto on: 07/09/2015 10:29 AM   Modules accepted: Orders

## 2015-07-10 LAB — URINE CULTURE: Colony Count: >=100000

## 2015-07-16 ENCOUNTER — Other Ambulatory Visit: Payer: Self-pay | Admitting: Family Medicine

## 2015-07-16 ENCOUNTER — Ambulatory Visit: Admission: RE | Admit: 2015-07-16 | Payer: BLUE CROSS/BLUE SHIELD | Source: Ambulatory Visit

## 2015-07-16 ENCOUNTER — Ambulatory Visit
Admission: RE | Admit: 2015-07-16 | Discharge: 2015-07-16 | Disposition: A | Discharge status: Home / Self Care | Payer: BLUE CROSS/BLUE SHIELD | Source: Ambulatory Visit | Attending: Family Medicine | Admitting: Family Medicine

## 2015-07-16 DIAGNOSIS — N6310 Unspecified lump in the right breast, unspecified quadrant: Secondary | ICD-10-CM

## 2016-05-14 ENCOUNTER — Encounter: Payer: Self-pay | Admitting: Family Medicine

## 2016-12-20 ENCOUNTER — Ambulatory Visit (INDEPENDENT_AMBULATORY_CARE_PROVIDER_SITE_OTHER): Payer: BLUE CROSS/BLUE SHIELD | Admitting: Family Medicine

## 2016-12-20 VITALS — BP 110/70 | HR 88 | Temp 98.4°F | Wt 161.0 lb

## 2016-12-20 DIAGNOSIS — G43829 Menstrual migraine, not intractable, without status migrainosus: Secondary | ICD-10-CM

## 2016-12-20 MED ORDER — SUMATRIPTAN SUCCINATE 50 MG PO TABS: 50.0000 mg | ORAL_TABLET | ORAL | 0 refills | Status: DC | PRN

## 2016-12-20 NOTE — Progress Notes (Signed)
   Sara Reynolds is a 46 y.o. female here for an acute visit.  History of Present Illness:   HPI:   Headache Patient presents for evaluation of headache. Symptoms began about several months ago. Generally, the headaches last about a few days and occur once per month. The headaches are usually worse during her menses. The headaches are usually throbbing and are located in unilaterally.  The patient rates her most severe headaches a 6 on a scale from 1 to 10. Recently, the headaches have been increasing in frequency. Work attendance or other daily activities are not affected by the headaches. Precipitating factors include: menses and stress. The headaches are usually not preceded by an aura. The patient denies decreased physical activity, depression, dizziness, loss of balance, muscle weakness, numbness of extremities, speech difficulties and vision problems. Home treatment has included ibuprofen, Imitrex oral and resting with marked improvement. Other history includes: migraine headaches diagnosed in the past.   PMHx, SurgHx, SocialHx, Medications, and Allergies were reviewed in the Visit Navigator and updated as appropriate.  Current Medications:   Marland Kitchen  Using a friend's Rx of Imitrex prn  No Known Allergies   Review of Systems:   Pertinent items are noted in the HPI. Otherwise, ROS is negative.  Vitals:   Vitals:   12/20/16 1118  BP: 110/70  Pulse: 88  Temp: 98.4 F (36.9 C)  TempSrc: Oral  SpO2: 98%  Weight: 161 lb (73 kg)     Body mass index is 27.64 kg/m.   Physical Exam   Physical Exam  Constitutional: She is oriented to person, place, and time. She appears well-nourished.  HENT:  Head: Normocephalic and atraumatic.  Eyes: Pupils are equal, round, and reactive to light. EOM are normal.  Neck: Normal range of motion. Neck supple.  Cardiovascular: Normal rate, regular rhythm, normal heart sounds and intact distal pulses.   Pulmonary/Chest: Effort normal.  Abdominal:  Soft.  Neurological: She is alert and oriented to person, place, and time. She displays normal reflexes. No cranial nerve deficit or sensory deficit. She exhibits normal muscle tone. Coordination normal.  Skin: Skin is warm.  Psychiatric: She has a normal mood and affect. Her behavior is normal.  Nursing note and vitals reviewed.   Assessment and Plan:   Sara Reynolds was seen today for headache.  Diagnoses and all orders for this visit:  Menstrual migraine without status migrainosus, not intractable -     SUMAtriptan (IMITREX) 50 MG tablet; Take 1 tablet (50 mg total) by mouth every 2 (two) hours as needed for migraine. May repeat in 2 hours if headache persists or recurs.   . Reviewed expectations re: course of current medical issues. . Discussed self-management of symptoms. . Outlined signs and symptoms indicating need for more acute intervention. . Patient verbalized understanding and all questions were answered. Marland Kitchen Health Maintenance issues including appropriate healthy diet, exercise, and smoking avoidance were discussed with patient. . See orders for this visit as documented in the electronic medical record. . Patient received an After Visit Summary.  Briscoe Deutscher, DO [place], [place] 12/20/2016  No future appointments.

## 2017-01-12 ENCOUNTER — Encounter (HOSPITAL_BASED_OUTPATIENT_CLINIC_OR_DEPARTMENT_OTHER): Payer: Self-pay

## 2017-01-12 ENCOUNTER — Telehealth: Payer: Self-pay | Admitting: Family Medicine

## 2017-01-12 ENCOUNTER — Ambulatory Visit (HOSPITAL_BASED_OUTPATIENT_CLINIC_OR_DEPARTMENT_OTHER)
Admission: RE | Admit: 2017-01-12 | Discharge: 2017-01-12 | Disposition: A | Discharge status: Home / Self Care | Payer: BLUE CROSS/BLUE SHIELD | Source: Ambulatory Visit | Attending: Family Medicine | Admitting: Family Medicine

## 2017-01-12 ENCOUNTER — Ambulatory Visit (INDEPENDENT_AMBULATORY_CARE_PROVIDER_SITE_OTHER): Payer: BLUE CROSS/BLUE SHIELD | Admitting: Family Medicine

## 2017-01-12 VITALS — BP 132/88 | HR 104 | Temp 98.2°F | Ht 64.0 in | Wt 165.2 lb

## 2017-01-12 DIAGNOSIS — R0602 Shortness of breath: Secondary | ICD-10-CM | POA: Diagnosis not present

## 2017-01-12 DIAGNOSIS — R0789 Other chest pain: Secondary | ICD-10-CM

## 2017-01-12 DIAGNOSIS — R7989 Other specified abnormal findings of blood chemistry: Secondary | ICD-10-CM

## 2017-01-12 LAB — D-DIMER, QUANTITATIVE: D-Dimer, Quant: 0.64 mcg/mL FEU — ABNORMAL HIGH (ref <0.50)

## 2017-01-12 LAB — COMPREHENSIVE METABOLIC PANEL
ALT: 11 U/L (ref 0–35)
AST: 14 U/L (ref 0–37)
Albumin: 4.4 g/dL (ref 3.5–5.2)
Alkaline Phosphatase: 57 U/L (ref 39–117)
BUN: 10 mg/dL (ref 6–23)
CO2: 26 mEq/L (ref 19–32)
Calcium: 9.4 mg/dL (ref 8.4–10.5)
Chloride: 102 mEq/L (ref 96–112)
Creatinine, Ser: 0.83 mg/dL (ref 0.40–1.20)
GFR: 78.87 mL/min (ref >60.00)
Glucose, Bld: 95 mg/dL (ref 70–99)
Potassium: 3.6 mEq/L (ref 3.5–5.1)
Sodium: 136 mEq/L (ref 135–145)
Total Bilirubin: 0.4 mg/dL (ref 0.2–1.2)
Total Protein: 7.7 g/dL (ref 6.0–8.3)

## 2017-01-12 LAB — CBC
HCT: 44.9 % (ref 36.0–46.0)
Hemoglobin: 14.5 g/dL (ref 12.0–15.0)
MCHC: 32.2 g/dL (ref 30.0–36.0)
MCV: 83.1 fl (ref 78.0–100.0)
Platelets: 234 10*3/uL (ref 150.0–400.0)
RBC: 5.4 Mil/uL — ABNORMAL HIGH (ref 3.87–5.11)
RDW: 15.5 % (ref 11.5–15.5)
WBC: 4.2 10*3/uL (ref 4.0–10.5)

## 2017-01-12 LAB — POCT URINE PREGNANCY: Preg Test, Ur: NEGATIVE

## 2017-01-12 LAB — TROPONIN I: TNIDX: 0 ug/l (ref 0.00–0.06)

## 2017-01-12 MED ORDER — PREDNISONE 20 MG PO TABS: ORAL_TABLET | ORAL | 0 refills | Status: DC

## 2017-01-12 MED ORDER — IOPAMIDOL (ISOVUE-370) INJECTION 76%
100.0000 mL | Freq: Once | INTRAVENOUS | Status: AC | PRN
  Administered 2017-01-12: 100 mL via INTRAVENOUS

## 2017-01-12 MED ORDER — ALBUTEROL SULFATE HFA 108 (90 BASE) MCG/ACT IN AERS: 2.0000 | INHALATION_SPRAY | Freq: Four times a day (QID) | RESPIRATORY_TRACT | 0 refills | Status: DC | PRN

## 2017-01-12 NOTE — Telephone Encounter (Signed)
°  Relation to AC:ZYSA Call back number:[phone] Pharmacy:  Reason for call:  Patient inquiring about CT results, please advise

## 2017-01-12 NOTE — Progress Notes (Addendum)
[facility] at [facility] [address], [place], [place] [address] [phone]  Date:  01/12/2017   Name:  Sara Reynolds   DOB:  Feb 29, 1971   MRN:  [id number]  PCP:  Midge Minium, MD    Chief Complaint: Shortness of Breath (with fatigue)   History of Present Illness:  Sara Reynolds is a 46 y.o. very pleasant female patient who presents with the following:  Here today with concern of SOB She has noted a "heavy feeling" in her chest for about 2 weeks-  She had not had this in the past No new medications or other changes except she was seen for headaches and took 1 imitrex per Dr. Juleen China, and she has used this in the past however and never had any trouble with it  The SOB is present when she gets up to walk.  She has felt tired and exhausted recently She "would not say chest pain",but she does have discomfort Never had a PE or DVT She did travel to the beach not long ago, otherwise no travel or immobility Not a smoker but she is a hair stylist and breathes in chemicals  She is not sure,but may have noted a little wheezing. Her son has asthma- she tried his albuterol a couple of times recently and it did help her feel better She is not really coughing No fever No hemoptysis  LMP 9/1 No sick contacts at home  Pt has no history of CAD.   No family history of same as far as she is aware  She is not on hormones No calf tenderness or swelling  Pulse Readings from Last 3 Encounters:  01/12/17 (!) 104  12/20/16 88  07/09/15 97      Patient Active Problem List   Diagnosis Date Noted  . Influenza B 07/09/2015  . UTI (urinary tract infection) 07/09/2015  . Pap smear for cervical cancer screening 05/14/2015  . Breast mass, right 05/14/2015  . Soft tissue mass 11/03/2014  . Depression 12/09/2012  . Routine general medical examination at a health care facility 08/26/2012    Past Medical History:  Diagnosis  Date  . Allergy   . Chicken pox   . Hay fever   . Thyroid goiter     Past Surgical History:  Procedure Laterality Date  . CESAREAN SECTION      Social History  Substance Use Topics  . Smoking status: Never Smoker  . Smokeless tobacco: Not on file  . Alcohol use Yes     Comment: social    Family History  Problem Relation Age of Onset  . Mental illness Mother   . Hypertension Father   . Diabetes Father   . Kidney disease Maternal Grandmother   . Hyperlipidemia Maternal Grandfather   . Hypertension Paternal Grandmother   . Diabetes Paternal Grandmother   . Stroke Paternal Grandfather   . Hypertension Paternal Grandfather   . Diabetes Paternal Grandfather     No Known Allergies  Medication list has been reviewed and updated.  Current Outpatient Prescriptions on File Prior to Visit  Medication Sig Dispense Refill  . SUMAtriptan (IMITREX) 50 MG tablet Take 1 tablet (50 mg total) by mouth every 2 (two) hours as needed for migraine. May repeat in 2 hours if headache persists or recurs. 10 tablet 0   No current facility-administered medications on file prior to visit.     Review of Systems:  As  per HPI- otherwise negative.   Physical Examination: Vitals:   01/12/17 1103  BP: 132/88  Pulse: (!) 104  Temp: 98.2 F (36.8 C)  SpO2: 100%   Vitals:   01/12/17 1103  Weight: 165 lb 3.2 oz (74.9 kg)  Height: 5' 4"  (1.626 m)   Body mass index is 28.36 kg/m. Ideal Body Weight: Weight in (lb) to have BMI = 25: 145.3  GEN: WDWN, NAD, Non-toxic, A & O x 3, looks well, mild overweight HEENT: Atraumatic, Normocephalic. Neck supple. No masses, No LAD. Ears and Nose: No external deformity. CV: RRR, No M/G/R. No JVD. No thrill. No extra heart sounds. PULM: CTA B, no wheezes, crackles, rhonchi. No retractions. No resp. distress. No accessory muscle use. ABD: S, NT, ND, +BS. No rebound. No HSM. EXTR: No c/c/e NEURO Normal gait.  PSYCH: Normally interactive. Conversant.  Not depressed or anxious appearing.  Calm demeanor.   EKG:  Low voltage and indeterminate axis, but no acute ST elevation or depression SR with rate of 88 Will have to scan into chart- we had to use our old EKG machine as the EMR compatible machine is not working  No old EKG for comparison  Dg Chest 2 View  Result Date: 01/12/2017 CLINICAL DATA:  Chest tightness for 2 weeks. EXAM: CHEST  2 VIEW COMPARISON:  None. FINDINGS: Lungs are clear. Heart size is normal. No pneumothorax or pleural fluid. No bony abnormality. IMPRESSION: Negative chest. Electronically Signed   By: Inge Rise M.D.   On: 01/12/2017 12:47    Assessment and Plan: Chest tightness - Plan: EKG 12-Lead, DG Chest 2 View, CBC, Comprehensive metabolic panel, Troponin I, D-Dimer, Quantitative, POCT urine pregnancy, predniSONE (DELTASONE) 20 MG tablet, albuterol (PROVENTIL HFA;VENTOLIN HFA) 108 (90 Base) MCG/ACT inhaler  Positive D dimer - Plan: CT Angio Chest W/Cm &/Or Wo Cm  Here today with a feeling of chest tightness for 2 weeks Discussed in detail with pt- greatest concern would be a lung issue such as a PE.  Would like to get a D dimer but she understands that if positive a CT angio will be necessary  Her sx, age, risk factors all argue against CAD, but will also obtain a troponin Offered to have her seen in the ER instead, but she prefers to proceed with stat lab work-up as above Signed Lamar Blinks, MD  Received her labs- troponin is negative but D dimer is high.  Will set up for a CT angio- alerted pt Results for orders placed or performed in visit on 01/12/17  CBC  Result Value Ref Range   WBC 4.2 4.0 - 10.5 K/uL   RBC 5.40 (H) 3.87 - 5.11 Mil/uL   Platelets 234.0 Repeated and verified X2. 150.0 - 400.0 K/uL   Hemoglobin 14.5 12.0 - 15.0 g/dL   HCT 44.9 36.0 - 46.0 %   MCV 83.1 78.0 - 100.0 fl   MCHC 32.2 30.0 - 36.0 g/dL   RDW 15.5 11.5 - 15.5 %  Comprehensive metabolic panel  Result Value Ref Range    Sodium 136 135 - 145 mEq/L   Potassium 3.6 3.5 - 5.1 mEq/L   Chloride 102 96 - 112 mEq/L   CO2 26 19 - 32 mEq/L   Glucose, Bld 95 70 - 99 mg/dL   BUN 10 6 - 23 mg/dL   Creatinine, Ser 0.83 0.40 - 1.20 mg/dL   Total Bilirubin 0.4 0.2 - 1.2 mg/dL   Alkaline Phosphatase 57 39 - 117 U/L  AST 14 0 - 37 U/L   ALT 11 0 - 35 U/L   Total Protein 7.7 6.0 - 8.3 g/dL   Albumin 4.4 3.5 - 5.2 g/dL   Calcium 9.4 8.4 - 10.5 mg/dL   GFR 78.87 >60.00 mL/min  Troponin I  Result Value Ref Range   TNIDX 0.00 0.00 - 0.06 ug/l  D-Dimer, Quantitative  Result Value Ref Range   D-Dimer, Quant 0.64 (H) <0.50 mcg/mL FEU  POCT urine pregnancy  Result Value Ref Range   Preg Test, Ur Negative Negative    Received her CT angio and called again- negative for PE Will have her try a short course of prednisone and prn albuterol for likely airway irritability due to recent extreme heat and humidity.  She is advised to seek immediate emergency care if there is any change or worsening of her sx and she agrees.  I will check in with her in the next 1-2 days   Meds ordered this encounter  Medications  . predniSONE (DELTASONE) 20 MG tablet    Sig: Take 1 pill daily for 5 days    Dispense:  5 tablet    Refill:  0  . albuterol (PROVENTIL HFA;VENTOLIN HFA) 108 (90 Base) MCG/ACT inhaler    Sig: Inhale 2 puffs into the lungs every 6 (six) hours as needed for wheezing or shortness of breath.    Dispense:  1 Inhaler    Refill:  0     Dg Chest 2 View  Result Date: 01/12/2017 CLINICAL DATA:  Chest tightness for 2 weeks. EXAM: CHEST  2 VIEW COMPARISON:  None. FINDINGS: Lungs are clear. Heart size is normal. No pneumothorax or pleural fluid. No bony abnormality. IMPRESSION: Negative chest. Electronically Signed   By: Inge Rise M.D.   On: 01/12/2017 12:47   Ct Angio Chest W/cm &/or Wo Cm  Result Date: 01/12/2017 CLINICAL DATA:  Chest heaviness for 2 weeks. Shortness of breath when walking. Fatigue. Chest  discomfort. EXAM: CT ANGIOGRAPHY CHEST WITH CONTRAST TECHNIQUE: Multidetector CT imaging of the chest was performed using the standard protocol during bolus administration of intravenous contrast. Multiplanar CT image reconstructions and MIPs were obtained to evaluate the vascular anatomy. CONTRAST:  100 cc Isovue 370 COMPARISON:  None. FINDINGS: Cardiovascular: Some of the most peripheral segmental and subsegmental pulmonary artery branches are difficult to definitively characterize due to mild patient breathing motion artifact, however, there is no pulmonary embolism identified within the main, lobar or segmental pulmonary arteries bilaterally. Heart size is normal. No pericardial effusion. No aortic aneurysm or dissection. Mediastinum/Nodes: No mass or enlarged lymph nodes within the mediastinum or perihilar regions. Esophagus appears normal. Trachea and central bronchi are unremarkable. Lungs/Pleura: Lungs are clear.  No pleural effusion or pneumothorax. Upper Abdomen: No acute abnormality. Musculoskeletal: No chest wall abnormality. No acute or significant osseous findings. Review of the MIP images confirms the above findings. IMPRESSION: No acute findings. No source for chest discomfort or shortness of breath. No pulmonary embolism identified, with mild study limitations detailed above. Lungs are clear. Electronically Signed   By: Franki Cabot M.D.   On: 01/12/2017 16:13    Called her to check on her 9/18- she reports feeling perhaps a bit better, just took her prednisone first dose today She will let me know if sx do not resolve over the next few days and in that case we may consider a stress test Other labs are ok, will send a copy to her in the mail

## 2017-01-12 NOTE — Patient Instructions (Signed)
Please go to lab, and then to the ground floor to have your chest film.  I will call you later today with your stat labs

## 2017-01-13 ENCOUNTER — Encounter: Payer: Self-pay | Admitting: Family Medicine

## 2017-01-13 ENCOUNTER — Telehealth: Payer: Self-pay | Admitting: *Deleted

## 2017-01-13 NOTE — Telephone Encounter (Signed)
Received out of range results from [facility], ordering provider is aware and has sent out letter; forwarded to provider/SLS 09/18

## 2017-01-19 ENCOUNTER — Encounter: Payer: Self-pay | Admitting: Family Medicine

## 2017-03-27 ENCOUNTER — Other Ambulatory Visit: Payer: Self-pay

## 2017-03-27 ENCOUNTER — Telehealth: Payer: Self-pay | Admitting: Family Medicine

## 2017-03-27 DIAGNOSIS — G43829 Menstrual migraine, not intractable, without status migrainosus: Secondary | ICD-10-CM

## 2017-03-27 MED ORDER — SUMATRIPTAN SUCCINATE 50 MG PO TABS: 50.0000 mg | ORAL_TABLET | ORAL | 1 refills | Status: DC | PRN

## 2017-03-27 MED ORDER — SUMATRIPTAN SUCCINATE 50 MG PO TABS: 50.0000 mg | ORAL_TABLET | ORAL | 0 refills | Status: DC | PRN

## 2017-03-27 NOTE — Telephone Encounter (Signed)
Copied from [place] [phone]. Topic: Quick Communication - See Telephone Encounter >> Mar 27, 2017 11:12 AM Vernona Rieger wrote: CRM for notification. See Telephone encounter for:  Pt states she saw wallace for migraines on a saturday. She gave her a script for SUMATRIPTAN 74m with no refills. She said this rx helped her out a lot and would like a refill, please. [facility] Call back # is [phone]/30/18. >> Mar 27, 2017 12:18 PM GDarl Householder RMA wrote: Patient has called again and is requesting a call back from Dr. W[place]uleen Chinaor  [place] >> Mar 27, 2017 12:33 PM SPercell BeltA wrote: Pt called again about his med and would like to know if it has been sent to pharmacy?  She said that she does not have the money to come in to be seen again and has this headache that she can not get rid of.  Please contact her today at [phone] >> Mar 27, 2017  1:43 PM OPara SkeansA wrote: [facility] Has not seen a ECareers information officer

## 2017-03-27 NOTE — Telephone Encounter (Signed)
Left message on pt.'s phone that request would be sent through.

## 2017-03-27 NOTE — Telephone Encounter (Signed)
Copied from [place] [phone]. Topic: Quick Communication - See Telephone Encounter >> Mar 27, 2017 11:12 AM Vernona Rieger wrote: CRM for notification. See Telephone encounter for:  Pt states she saw wallace for migraines on a saturday. She gave her a script for SUMATRIPTAN 54m with no refills. She said this rx helped her out a lot and would like a refill, please. [facility] Call back # is [phone]/30/18.

## 2017-03-27 NOTE — Telephone Encounter (Signed)
[place] for refill of Sumatriptan, #10, 1 refill

## 2017-03-27 NOTE — Telephone Encounter (Signed)
Medication filled to pharmacy as requested.  Called and LMOVM to inform pt.

## 2017-03-27 NOTE — Telephone Encounter (Signed)
Imitrex refilled as requested.

## 2017-07-08 ENCOUNTER — Telehealth: Payer: Self-pay | Admitting: *Deleted

## 2017-07-08 NOTE — Telephone Encounter (Signed)
Copied from [place]. Topic: General - Other >> Jul 07, 2017 10:27 AM Oneta Rack wrote: Reason for CRM:  Relation to pt: self  Call back number:[phone]   Reason for call:  Patient would like to transfer care from Dr. Birdie Riddle to Dr. Lorelei Pont, please advise >> Jul 07, 2017 10:30 AM Oneta Rack wrote: Reason for CRM:   Relation to pt: self  Call back number: [phone]   Reason for call:  Patient would like to transfer care from Dr. Birdie Riddle to Dr. Lorelei Pont, please advise >> Jul 07, 2017 10:47 AM Katina Dung, CMA wrote: Routing to providers to see if transfer from [place] to [place] is approved.  >> Jul 07, 2017 11:22 AM Midge Minium, MD wrote: Madaline Brilliant to switch

## 2017-07-08 NOTE — Telephone Encounter (Signed)
ok 

## 2017-07-09 NOTE — Telephone Encounter (Signed)
LVM for pt to schedule a transfer pt appt from Dr Birdie Riddle to Dr Lorelei Pont

## 2017-08-22 NOTE — Progress Notes (Addendum)
[facility] [address] [place], [place], [place] [address] [phone]  Date:  08/24/2017   Name:  Sara Reynolds   DOB:  02/24/1971   MRN:  [id number]  PCP:  Midge Minium, MD    Chief Complaint: Tranfer of Care (annual physical )   History of Present Illness:  Sara Reynolds is a 47 y.o. very pleasant female patient who presents with the following:  Transferring care from Dr. Birdie Riddle to myself- however I did see her back in September of last year:  Here today with a feeling of chest tightness for 2 weeks Discussed in detail with pt- greatest concern would be a lung issue such as a PE.  Would like to get a D dimer but she understands that if positive a CT angio will be necessary  Her sx, age, risk factors all argue against CAD, but will also obtain a troponin Offered to have her seen in the ER instead, but she prefers to proceed with stat lab work-up as above  She had a CT that was negative after a positive D dimer  She actually requests a CPE today- we do her paps for her, can do today  Tetanus: she is not sure, could use a booster today Labs: could use complete labs today Pap:  1/17- she did have a positive HPV and conization about 10 years ago  Mammo: 3/17- see details below She is fasting today- did a CBC and CMP back in September LMP was about a month ago  She is a Haematologist, 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  Her eldest has high functioning autism  Her children are 68, 45 and 15 In her free time she likes to walk, read, and spend time with her friends.    She does have a ?cyst on her right chest wall, located in the mid axillary line or posterior . She thinks it has been there about 10 years now-  This was evaluated on [place] about 2 years ago-  IMPRESSION: 1. Indeterminate hypoechoic masses in the right breast/lateral chest wall at 9 o'clock 25 cm from the nipple. 2. Probable benign asymmetric  fibroglandular tissue in the upper-outer quadrant of the right breast. RECOMMENDATION: 1. Ultrasound-guided core biopsy of the adjacent hypoechoic masses in the right breast/ lateral chest at 9 o'clock 25 cm from the nipple is recommended. This will be scheduled at the patient's convenience. 2. Short-term interval followup mammogram of the probable asymmetric fibroglandular tissue in the upper-outer quadrant of the right breast is recommended in 6 months.  However at that time her eldest child- who has special needs- was having a lot of trouble with his stomach so she did not follow-up She does have monthly menstrual cycles, but they are lighter than in the past   She does get migraine- only occasionally with aura- and does need imitrex refill   Patient Active Problem List   Diagnosis Date Noted  . Influenza B 07/09/2015  . UTI (urinary tract infection) 07/09/2015  . Pap smear for cervical cancer screening 05/14/2015  . Breast mass, right 05/14/2015  . Soft tissue mass 11/03/2014  . Depression 12/09/2012  . Routine general medical examination at a health care facility 08/26/2012    Past Medical History:  Diagnosis Date  . Allergy   . Chicken pox   . Hay fever   . Thyroid goiter     Past Surgical History:  Procedure Laterality  Date  . CESAREAN SECTION      Social History   Tobacco Use  . Smoking status: Never Smoker  Substance Use Topics  . Alcohol use: Yes    Comment: social  . Drug use: No    Family History  Problem Relation Age of Onset  . Mental illness Mother   . Hypertension Father   . Diabetes Father   . Kidney disease Maternal Grandmother   . Hyperlipidemia Maternal Grandfather   . Hypertension Paternal Grandmother   . Diabetes Paternal Grandmother   . Stroke Paternal Grandfather   . Hypertension Paternal Grandfather   . Diabetes Paternal Grandfather     No Known Allergies  Medication list has been reviewed and updated.  Current Outpatient  Medications on File Prior to Visit  Medication Sig Dispense Refill  . SUMAtriptan (IMITREX) 50 MG tablet Take 1 tablet (50 mg total) by mouth every 2 (two) hours as needed for migraine. May repeat in 2 hours if headache persists or recurs. 10 tablet 1  . albuterol (PROVENTIL HFA;VENTOLIN HFA) 108 (90 Base) MCG/ACT inhaler Inhale 2 puffs into the lungs every 6 (six) hours as needed for wheezing or shortness of breath. (Patient not taking: Reported on 08/24/2017) 1 Inhaler 0   No current facility-administered medications on file prior to visit.     Review of Systems:  As per HPI- otherwise negative. No fever or chills No CP or SOB with exercise She tries to work out about 3-4x a week when she can   Physical Examination: Vitals:   08/24/17 0934  BP: (!) 130/92  Pulse: 82  Resp: 16  SpO2: 100%   Vitals:   08/24/17 0934  Weight: 168 lb (76.2 kg)  Height: 5' 4"  (1.626 m)   Body mass index is 28.84 kg/m. Ideal Body Weight: Weight in (lb) to have BMI = 25: 145.3  GEN: WDWN, NAD, Non-toxic, A & O x 3, mild overweight, looks well  HEENT: Atraumatic, Normocephalic. Neck supple. No masses, No LAD.  Bilateral TM wnl, oropharynx normal.  PEERL,EOMI.   Ears and Nose: No external deformity. CV: RRR, No M/G/R. No JVD. No thrill. No extra heart sounds. PULM: CTA B, no wheezes, crackles, rhonchi. No retractions. No resp. distress. No accessory muscle use. ABD: S, NT, ND, +BS. No rebound. No HSM. EXTR: No c/c/e NEURO Normal gait.  PSYCH: Normally interactive. Conversant. Not depressed or anxious appearing.  Calm demeanor.  Breast: normal exam, no masses/ dimpling/ discharge. She does have mobile, caper sized subcue mass in her mid axillary line or even posterior on the right, at the level of a bra band. This is the area of concern noted before Pelvic: normal, no vaginal lesions or discharge. Uterus normal, no CMT, no adnexal tendereness or masses    Assessment and Plan: Physical  exam  Screening for cervical cancer - Plan: Cytology - PAP  Screening for hyperlipidemia - Plan: Lipid panel  Screening for deficiency anemia - Plan: CBC  Screening for diabetes mellitus - Plan: Basic metabolic panel, Hemoglobin A1c  Screening for breast cancer - Plan: MM DIAG BREAST TOMO BILATERAL, US BREAST COMPLETE UNI RIGHT INC AXILLA  Nodule of chest wall - Plan: US BREAST COMPLETE UNI RIGHT INC AXILLA  Immunization due - Plan: Tdap vaccine greater than or equal to 7yo IM  Menstrual migraine without status migrainosus, not intractable - Prevention, symptomatic care, and red flags reviewed. - Plan: SUMAtriptan (IMITREX) 50 MG tablet  CPE today Labs, pap pending Referral to  breast center for follow-up of breast concens from 2017 tdap given Refilled imitrex   Signed Lamar Blinks, MD  Received her labs 4/29  Results for orders placed or performed in visit on 08/24/17  CBC  Result Value Ref Range   WBC 3.9 (L) 4.0 - 10.5 K/uL   RBC 5.17 (H) 3.87 - 5.11 Mil/uL   Platelets 218.0 150.0 - 400.0 K/uL   Hemoglobin 13.7 12.0 - 15.0 g/dL   HCT 41.8 36.0 - 46.0 %   MCV 80.9 78.0 - 100.0 fl   MCHC 32.8 30.0 - 36.0 g/dL   RDW 16.4 (H) 11.5 - 58.8 %  Basic metabolic panel  Result Value Ref Range   Sodium 140 135 - 145 mEq/L   Potassium 4.4 3.5 - 5.1 mEq/L   Chloride 105 96 - 112 mEq/L   CO2 30 19 - 32 mEq/L   Glucose, Bld 93 70 - 99 mg/dL   BUN 13 6 - 23 mg/dL   Creatinine, Ser 0.73 0.40 - 1.20 mg/dL   Calcium 9.3 8.4 - 10.5 mg/dL   GFR 91.22 >60.00 mL/min  Hemoglobin A1c  Result Value Ref Range   Hgb A1c MFr Bld 5.5 4.6 - 6.5 %  Lipid panel  Result Value Ref Range   Cholesterol 140 0 - 200 mg/dL   Triglycerides 71.0 0.0 - 149.0 mg/dL   HDL 54.80 >39.00 mg/dL   VLDL 14.2 0.0 - 40.0 mg/dL   LDL Cholesterol 71 0 - 99 mg/dL   Total CHOL/HDL Ratio 3    NonHDL 84.79    Message to pt  Blood count is ok- minimally low white blood cell count is unlikely to be of any  concern, we will recheck at next labs Metabolic profile is normal A1c is normal Cholesterol is favorable Just waiting on your pap- will be back in touch Take care  Received pap 4/30- normal Message to pt  Adequacy Satisfactory for evaluation endocervical/transformation zone component PRESENT.   Diagnosis NEGATIVE FOR INTRAEPITHELIAL LESIONS OR MALIGNANCY.   HPV NOT DETECTED   Comment: Normal Reference Range - NOT Detected  Material Submitted CervicoVaginal Pap [ThinPrep Imaged]   CYTOLOGY - PAP PAP RESULT

## 2017-08-24 ENCOUNTER — Other Ambulatory Visit (HOSPITAL_COMMUNITY)
Admission: RE | Admit: 2017-08-24 | Discharge: 2017-08-24 | Disposition: A | Discharge status: Home / Self Care | Payer: BLUE CROSS/BLUE SHIELD | Source: Ambulatory Visit | Attending: Family Medicine | Admitting: Family Medicine

## 2017-08-24 ENCOUNTER — Encounter: Payer: Self-pay | Admitting: Family Medicine

## 2017-08-24 ENCOUNTER — Ambulatory Visit (INDEPENDENT_AMBULATORY_CARE_PROVIDER_SITE_OTHER): Payer: BLUE CROSS/BLUE SHIELD | Admitting: Family Medicine

## 2017-08-24 VITALS — BP 130/92 | HR 82 | Resp 16 | Ht 64.0 in | Wt 168.0 lb

## 2017-08-24 DIAGNOSIS — Z130 Encounter for screening for diseases of the blood and blood-forming organs and certain disorders involving the immune mechanism: Secondary | ICD-10-CM

## 2017-08-24 DIAGNOSIS — G43829 Menstrual migraine, not intractable, without status migrainosus: Secondary | ICD-10-CM

## 2017-08-24 DIAGNOSIS — Z131 Encounter for screening for diabetes mellitus: Secondary | ICD-10-CM

## 2017-08-24 DIAGNOSIS — Z13220 Encounter for screening for lipoid disorders: Secondary | ICD-10-CM | POA: Diagnosis not present

## 2017-08-24 DIAGNOSIS — Z0000 Encounter for general adult medical examination without abnormal findings: Secondary | ICD-10-CM

## 2017-08-24 DIAGNOSIS — Z1231 Encounter for screening mammogram for malignant neoplasm of breast: Secondary | ICD-10-CM

## 2017-08-24 DIAGNOSIS — Z23 Encounter for immunization: Secondary | ICD-10-CM

## 2017-08-24 DIAGNOSIS — Z124 Encounter for screening for malignant neoplasm of cervix: Secondary | ICD-10-CM

## 2017-08-24 DIAGNOSIS — R222 Localized swelling, mass and lump, trunk: Secondary | ICD-10-CM | POA: Diagnosis not present

## 2017-08-24 DIAGNOSIS — Z1239 Encounter for other screening for malignant neoplasm of breast: Secondary | ICD-10-CM

## 2017-08-24 LAB — BASIC METABOLIC PANEL
BUN: 13 mg/dL (ref 6–23)
CO2: 30 mEq/L (ref 19–32)
Calcium: 9.3 mg/dL (ref 8.4–10.5)
Chloride: 105 mEq/L (ref 96–112)
Creatinine, Ser: 0.73 mg/dL (ref 0.40–1.20)
GFR: 91.22 mL/min (ref >60.00)
Glucose, Bld: 93 mg/dL (ref 70–99)
Potassium: 4.4 mEq/L (ref 3.5–5.1)
Sodium: 140 mEq/L (ref 135–145)

## 2017-08-24 LAB — CBC
HCT: 41.8 % (ref 36.0–46.0)
Hemoglobin: 13.7 g/dL (ref 12.0–15.0)
MCHC: 32.8 g/dL (ref 30.0–36.0)
MCV: 80.9 fl (ref 78.0–100.0)
Platelets: 218 10*3/uL (ref 150.0–400.0)
RBC: 5.17 Mil/uL — ABNORMAL HIGH (ref 3.87–5.11)
RDW: 16.4 % — ABNORMAL HIGH (ref 11.5–15.5)
WBC: 3.9 10*3/uL — ABNORMAL LOW (ref 4.0–10.5)

## 2017-08-24 LAB — LIPID PANEL
Cholesterol: 140 mg/dL (ref 0–200)
HDL: 54.8 mg/dL (ref >39.00)
LDL Cholesterol: 71 mg/dL (ref 0–99)
NonHDL: 84.79
Total CHOL/HDL Ratio: 3
Triglycerides: 71 mg/dL (ref 0.0–149.0)
VLDL: 14.2 mg/dL (ref 0.0–40.0)

## 2017-08-24 LAB — HEMOGLOBIN A1C: Hgb A1c MFr Bld: 5.5 % (ref 4.6–6.5)

## 2017-08-24 MED ORDER — SUMATRIPTAN SUCCINATE 50 MG PO TABS: 50.0000 mg | ORAL_TABLET | ORAL | 5 refills | Status: DC | PRN

## 2017-08-24 NOTE — Patient Instructions (Signed)
I will be in touch with your labs and pap asap- will also set up a mammogram and ultrasound for you at the breast center in [place], [place] Adopting a healthy lifestyle and getting preventive care can go a long way to promote health and wellness. Talk with your health care provider about what schedule of regular examinations is right for you. This is a good chance for you to check in with your provider about disease prevention and staying healthy. In between checkups, there are plenty of things you can do on your own. Experts have done a lot of research about which lifestyle changes and preventive measures are most likely to keep you healthy. Ask your health care provider for more information. Weight and diet Eat a healthy diet  Be sure to include plenty of vegetables, fruits, low-fat dairy products, and lean protein.  Do not eat a lot of foods high in solid fats, added sugars, or salt.  Get regular exercise. This is one of the most important things you can do for your health. ? Most adults should exercise for at least 150 minutes each week. The exercise should increase your heart rate and make you sweat (moderate-intensity exercise). ? Most adults should also do strengthening exercises at least twice a week. This is in addition to the moderate-intensity exercise.  Maintain a healthy weight  Body mass index (BMI) is a measurement that can be used to identify possible weight problems. It estimates body fat based on height and weight. Your health care provider can help determine your BMI and help you achieve or maintain a healthy weight.  For females 7 years of age and older: ? A BMI below 18.5 is considered underweight. ? A BMI of 18.5 to 24.9 is normal. ? A BMI of 25 to 29.9 is considered overweight. ? A BMI of 30 and above is considered obese.  Watch levels of cholesterol and blood lipids  You should start having your blood tested for lipids and cholesterol at 46  years of age, then have this test every 5 years.  You may need to have your cholesterol levels checked more often if: ? Your lipid or cholesterol levels are high. ? You are older than 46 years of age. ? You are at high risk for heart disease.  Cancer screening Lung Cancer  Lung cancer screening is recommended for adults 32-60 years old who are at high risk for lung cancer because of a history of smoking.  A yearly low-dose CT scan of the lungs is recommended for people who: ? Currently smoke. ? Have quit within the past 15 years. ? Have at least a 30-pack-year history of smoking. A pack year is smoking an average of one pack of cigarettes a day for 1 year.  Yearly screening should continue until it has been 15 years since you quit.  Yearly screening should stop if you develop a health problem that would prevent you from having lung cancer treatment.  Breast Cancer  Practice breast self-awareness. This means understanding how your breasts normally appear and feel.  It also means doing regular breast self-exams. Let your health care provider know about any changes, no matter how small.  If you are in your 20s or 30s, you should have a clinical breast exam (CBE) by a health care provider every 1-3 years as part of a regular health exam.  If you are 76 or older, have a CBE every year. Also consider having a breast X-ray (mammogram)  every year.  If you have a family history of breast cancer, talk to your health care provider about genetic screening.  If you are at high risk for breast cancer, talk to your health care provider about having an MRI and a mammogram every year.  Breast cancer gene (BRCA) assessment is recommended for women who have family members with BRCA-related cancers. BRCA-related cancers include: ? Breast. ? Ovarian. ? Tubal. ? Peritoneal cancers.  Results of the assessment will determine the need for genetic counseling and BRCA1 and BRCA2 testing.  Cervical  Cancer Your health care provider may recommend that you be screened regularly for cancer of the pelvic organs (ovaries, uterus, and vagina). This screening involves a pelvic examination, including checking for microscopic changes to the surface of your cervix (Pap test). You may be encouraged to have this screening done every 3 years, beginning at age 45.  For women ages 53-65, health care providers may recommend pelvic exams and Pap testing every 3 years, or they may recommend the Pap and pelvic exam, combined with testing for human papilloma virus (HPV), every 5 years. Some types of HPV increase your risk of cervical cancer. Testing for HPV may also be done on women of any age with unclear Pap test results.  Other health care providers may not recommend any screening for nonpregnant women who are considered low risk for pelvic cancer and who do not have symptoms. Ask your health care provider if a screening pelvic exam is right for you.  If you have had past treatment for cervical cancer or a condition that could lead to cancer, you need Pap tests and screening for cancer for at least 20 years after your treatment. If Pap tests have been discontinued, your risk factors (such as having a new sexual partner) need to be reassessed to determine if screening should resume. Some women have medical problems that increase the chance of getting cervical cancer. In these cases, your health care provider may recommend more frequent screening and Pap tests.  Colorectal Cancer  This type of cancer can be detected and often prevented.  Routine colorectal cancer screening usually begins at 46 years of age and continues through 46 years of age.  Your health care provider may recommend screening at an earlier age if you have risk factors for colon cancer.  Your health care provider may also recommend using home test kits to check for hidden blood in the stool.  A small camera at the end of a tube can be used to  examine your colon directly (sigmoidoscopy or colonoscopy). This is done to check for the earliest forms of colorectal cancer.  Routine screening usually begins at age 74.  Direct examination of the colon should be repeated every 5-10 years through 46 years of age. However, you may need to be screened more often if early forms of precancerous polyps or small growths are found.  Skin Cancer  Check your skin from head to toe regularly.  Tell your health care provider about any new moles or changes in moles, especially if there is a change in a mole's shape or color.  Also tell your health care provider if you have a mole that is larger than the size of a pencil eraser.  Always use sunscreen. Apply sunscreen liberally and repeatedly throughout the day.  Protect yourself by wearing long sleeves, pants, a wide-brimmed hat, and sunglasses whenever you are outside.  Heart disease, diabetes, and high blood pressure  High blood pressure causes  heart disease and increases the risk of stroke. High blood pressure is more likely to develop in: ? People who have blood pressure in the high end of the normal range (130-139/85-89 mm Hg). ? People who are overweight or obese. ? People who are African American.  If you are 51-35 years of age, have your blood pressure checked every 3-5 years. If you are 45 years of age or older, have your blood pressure checked every year. You should have your blood pressure measured twice-once when you are at a hospital or clinic, and once when you are not at a hospital or clinic. Record the average of the two measurements. To check your blood pressure when you are not at a hospital or clinic, you can use: ? An automated blood pressure machine at a pharmacy. ? A home blood pressure monitor.  If you are between 108 years and 69 years old, ask your health care provider if you should take aspirin to prevent strokes.  Have regular diabetes screenings. This involves taking a  blood sample to check your fasting blood sugar level. ? If you are at a normal weight and have a low risk for diabetes, have this test once every three years after 46 years of age. ? If you are overweight and have a high risk for diabetes, consider being tested at a younger age or more often. Preventing infection Hepatitis B  If you have a higher risk for hepatitis B, you should be screened for this virus. You are considered at high risk for hepatitis B if: ? You were born in a country where hepatitis B is common. Ask your health care provider which countries are considered high risk. ? Your parents were born in a high-risk country, and you have not been immunized against hepatitis B (hepatitis B vaccine). ? You have HIV or AIDS. ? You use needles to inject street drugs. ? You live with someone who has hepatitis B. ? You have had sex with someone who has hepatitis B. ? You get hemodialysis treatment. ? You take certain medicines for conditions, including cancer, organ transplantation, and autoimmune conditions.  Hepatitis C  Blood testing is recommended for: ? Everyone born from [birth year] through 1964. ? Anyone with known risk factors for hepatitis C.  Sexually transmitted infections (STIs)  You should be screened for sexually transmitted infections (STIs) including gonorrhea and chlamydia if: ? You are sexually active and are younger than 46 years of age. ? You are older than 46 years of age and your health care provider tells you that you are at risk for this type of infection. ? Your sexual activity has changed since you were last screened and you are at an increased risk for chlamydia or gonorrhea. Ask your health care provider if you are at risk.  If you do not have HIV, but are at risk, it may be recommended that you take a prescription medicine daily to prevent HIV infection. This is called pre-exposure prophylaxis (PrEP). You are considered at risk if: ? You are sexually active and  do not regularly use condoms or know the HIV status of your partner(s). ? You take drugs by injection. ? You are sexually active with a partner who has HIV.  Talk with your health care provider about whether you are at high risk of being infected with HIV. If you choose to begin PrEP, you should first be tested for HIV. You should then be tested every 3 months for as long as  you are taking PrEP. Pregnancy  If you are premenopausal and you may become pregnant, ask your health care provider about preconception counseling.  If you may become pregnant, take 400 to 800 micrograms (mcg) of folic acid every day.  If you want to prevent pregnancy, talk to your health care provider about birth control (contraception). Osteoporosis and menopause  Osteoporosis is a disease in which the bones lose minerals and strength with aging. This can result in serious bone fractures. Your risk for osteoporosis can be identified using a bone density scan.  If you are 77 years of age or older, or if you are at risk for osteoporosis and fractures, ask your health care provider if you should be screened.  Ask your health care provider whether you should take a calcium or vitamin D supplement to lower your risk for osteoporosis.  Menopause may have certain physical symptoms and risks.  Hormone replacement therapy may reduce some of these symptoms and risks. Talk to your health care provider about whether hormone replacement therapy is right for you. Follow these instructions at home:  Schedule regular health, dental, and eye exams.  Stay current with your immunizations.  Do not use any tobacco products including cigarettes, chewing tobacco, or electronic cigarettes.  If you are pregnant, do not drink alcohol.  If you are breastfeeding, limit how much and how often you drink alcohol.  Limit alcohol intake to no more than 1 drink per day for nonpregnant women. One drink equals 12 ounces of beer, 5 ounces of  wine, or 1 ounces of hard liquor.  Do not use street drugs.  Do not share needles.  Ask your health care provider for help if you need support or information about quitting drugs.  Tell your health care provider if you often feel depressed.  Tell your health care provider if you have ever been abused or do not feel safe at home. This information is not intended to replace advice given to you by your health care provider. Make sure you discuss any questions you have with your health care provider. Document Released: 10/28/2010 Document Revised: 09/20/2015 Document Reviewed: 01/16/2015 Elsevier Interactive Patient Education  [employer].

## 2017-08-25 ENCOUNTER — Encounter: Payer: Self-pay | Admitting: Family Medicine

## 2017-08-25 LAB — CYTOLOGY - PAP
Diagnosis: NEGATIVE
HPV: NOT DETECTED

## 2017-08-28 ENCOUNTER — Encounter: Payer: Self-pay | Admitting: Family Medicine

## 2017-12-22 ENCOUNTER — Other Ambulatory Visit: Payer: Self-pay | Admitting: Family Medicine

## 2017-12-22 DIAGNOSIS — R222 Localized swelling, mass and lump, trunk: Secondary | ICD-10-CM

## 2017-12-22 DIAGNOSIS — N6310 Unspecified lump in the right breast, unspecified quadrant: Secondary | ICD-10-CM

## 2018-01-04 ENCOUNTER — Other Ambulatory Visit: Payer: BLUE CROSS/BLUE SHIELD

## 2018-01-11 ENCOUNTER — Other Ambulatory Visit: Payer: Self-pay

## 2018-01-11 ENCOUNTER — Ambulatory Visit
Admission: RE | Admit: 2018-01-11 | Discharge: 2018-01-11 | Disposition: A | Discharge status: Home / Self Care | Payer: BLUE CROSS/BLUE SHIELD | Source: Ambulatory Visit | Attending: Family Medicine | Admitting: Family Medicine

## 2018-01-11 ENCOUNTER — Other Ambulatory Visit: Payer: Self-pay | Admitting: Family Medicine

## 2018-01-11 DIAGNOSIS — N6001 Solitary cyst of right breast: Secondary | ICD-10-CM | POA: Diagnosis not present

## 2018-01-11 DIAGNOSIS — R222 Localized swelling, mass and lump, trunk: Secondary | ICD-10-CM

## 2018-01-11 DIAGNOSIS — R922 Inconclusive mammogram: Secondary | ICD-10-CM | POA: Diagnosis not present

## 2018-01-18 ENCOUNTER — Ambulatory Visit
Admission: RE | Admit: 2018-01-18 | Discharge: 2018-01-18 | Disposition: A | Discharge status: Home / Self Care | Payer: BLUE CROSS/BLUE SHIELD | Source: Ambulatory Visit | Attending: Family Medicine | Admitting: Family Medicine

## 2018-01-18 ENCOUNTER — Other Ambulatory Visit: Payer: Self-pay | Admitting: Family Medicine

## 2018-01-18 DIAGNOSIS — D235 Other benign neoplasm of skin of trunk: Secondary | ICD-10-CM | POA: Diagnosis not present

## 2018-01-18 DIAGNOSIS — R222 Localized swelling, mass and lump, trunk: Secondary | ICD-10-CM

## 2018-01-18 DIAGNOSIS — N6313 Unspecified lump in the right breast, lower outer quadrant: Secondary | ICD-10-CM | POA: Diagnosis not present

## 2018-01-24 ENCOUNTER — Encounter: Payer: Self-pay | Admitting: Family Medicine

## 2018-01-24 DIAGNOSIS — N63 Unspecified lump in unspecified breast: Secondary | ICD-10-CM

## 2018-01-25 ENCOUNTER — Encounter: Payer: Self-pay | Admitting: Family Medicine

## 2018-07-08 ENCOUNTER — Ambulatory Visit: Payer: Self-pay

## 2018-07-08 NOTE — Telephone Encounter (Signed)
Returned call to patient who states she has had a cough for at least 3 weeks.. She says that her family about a month ago all hd the flu.  Every is health now but she continues with a dry cough and feeling real tired.  She has treated with OTC robitussin with little effect.  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  She denies fever but states she may have a low grade. She states that as she talks she feels SOB and stops to take a deep breath. Pt is speaking in full sentences without distress. Appointment scheduled per protocol.  Care advice read to patient.  Pt verbalized understanding of all instructions.  Reason for Disposition . Cough has been present for > 3 weeks  Answer Assessment - Initial Assessment Questions 1. ONSET: "When did the cough begin?"      4 weeks ago 2. SEVERITY: "How bad is the cough today?"      Worse at night some expectorant at night clear 3. RESPIRATORY DISTRESS: "Describe your breathing."     Feels SOB 4. FEVER: "Do you have a fever?" If so, ask: "What is your temperature, how was it measured, and when did it start?"    No possible low grade 5. HEMOPTYSIS: "Are you coughing up any blood?" If so ask: "How much?" (flecks, streaks, tablespoons, etc.)    No 6. TREATMENT: "What have you done so far to treat the cough?" (e.g., meds, fluids, humidifier)     Humidifier robitussin 7. CARDIAC HISTORY: "Do you have any history of heart disease?" (e.g., heart attack, congestive heart failure)      no 8. LUNG HISTORY: "Do you have any history of lung disease?"  (e.g., pulmonary embolus, asthma, emphysema)     no 9. PE RISK FACTORS: "Do you have a history of blood clots?" (or: recent major surgery, recent prolonged travel, bedridden)   no 10. OTHER SYMPTOMS: "Do you have any other symptoms? (e.g., runny nose, wheezing, chest pain)     Very tired, 11. PREGNANCY: "Is there any chance you are pregnant?" "When was your last menstrual period?"      No  last menses 1 month ago 12. TRAVEL: "Have you traveled out of the country in the last month?" (e.g., travel history, exposures)      No co workers have traveled out of the country  Protocols used: [place]

## 2018-07-09 ENCOUNTER — Ambulatory Visit: Payer: BLUE CROSS/BLUE SHIELD | Admitting: Medical

## 2018-07-26 ENCOUNTER — Encounter: Payer: BLUE CROSS/BLUE SHIELD | Admitting: Family Medicine

## 2018-09-25 NOTE — Progress Notes (Addendum)
[place] [facility], [place], [place] [address] [phone]  Date:  09/27/2018   Name:  Sara Reynolds   DOB:  May 19, 1970   MRN:  [id number]  PCP:  Darreld Mclean, MD    Chief Complaint: Annual Exam   History of Present Illness:  Sara Reynolds is a 48 y.o. very pleasant female patient who presents with the following:  Sara Reynolds is here today for complete physical exam She is a generally healthy young woman, history of depression and migraine headache I last saw her in the office in [place] 2019 She got pretty sick in February of this year and wonders if she had COVID 19.  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  Her oldest has high functioning autism, they are 46, 59, 47 years old.  They have adjusted to pandemic conditions quite well  At her physical last year there was concern about a possible mass on her right chest wall lateral to the breast.  She had a diagnostic mammogram and then underwent a biopsy of the mass.  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  She might like to have this removed eventually  Her migraine headaches and not overly bothersome, tend to occur around the time of her menses.  Patient Active Problem List   Diagnosis Date Noted  . Breast mass, right 05/14/2015  . Soft tissue mass 11/03/2014  . Depression 12/09/2012  .  Routine general medical examination at a health care facility 08/26/2012    Past Medical History:  Diagnosis Date  . Allergy   . Chicken pox   . Hay fever   . Thyroid goiter     Past Surgical History:  Procedure Laterality Date  . CESAREAN SECTION      Social History   Tobacco Use  . Smoking status: Never Smoker  Substance Use Topics  . Alcohol use: Yes    Comment: social  . Drug use: No    Family History  Problem Relation Age of Onset  . Mental illness Mother   . Hypertension Father   . Diabetes Father   . Kidney disease Maternal Grandmother   . Hyperlipidemia Maternal Grandfather   . Hypertension Paternal Grandmother   . Diabetes Paternal Grandmother   . Stroke Paternal Grandfather   . Hypertension Paternal Grandfather   . Diabetes Paternal Grandfather     No Known Allergies  Medication list has been reviewed and updated.  Current Outpatient Medications on File Prior to Visit  Medication Sig Dispense Refill  . albuterol (PROVENTIL HFA;VENTOLIN HFA) 108 (90 Base) MCG/ACT inhaler Inhale 2 puffs into the lungs every 6 (six) hours as needed for wheezing or shortness of breath. 1 Inhaler 0  . SUMAtriptan (IMITREX) 50 MG tablet Take 1 tablet (50 mg total) by mouth every 2 (  two) hours as needed for migraine. May repeat in 2 hours if headache persists or recurs. 10 tablet 5   No current facility-administered medications on file prior to visit.     Review of Systems:  As per HPI- otherwise negative.   Physical Examination: Vitals:   09/27/18 1302  BP: 120/82  Pulse: 86  Temp: 98.6 F (37 C)  SpO2: 98%   Vitals:   09/27/18 1302  Weight: 178 lb 2 oz (80.8 kg)  Height: 5' 4"  (1.626 m)   Body mass index is 30.58 kg/m. Ideal Body Weight: Weight in (lb) to have BMI = 25: 145.3  GEN: WDWN, NAD, Non-toxic, A & O x 3, looks well, overweight  HEENT: Atraumatic, Normocephalic. Neck supple. No masses, No LAD.   Bilateral TM wnl, oropharynx normal.  PEERL,EOMI.    Ears and Nose: No external deformity. CV: RRR, No M/G/R. No JVD. No thrill. No extra heart sounds. PULM: CTA B, no wheezes, crackles, rhonchi. No retractions. No resp. distress. No accessory muscle use. ABD: S, NT, ND, +BS. No rebound. No HSM. EXTR: No c/c/e NEURO Normal gait.  PSYCH: Normally interactive. Conversant. Not depressed or anxious appearing.  Calm demeanor.  Breast: normal exam, no masses/ dimpling/ discharge Small scar on right side lateral to breast from bx done last year  Small mobile mass is still palpable, size of a small blueberry  Assessment and Plan: Physical exam  Menstrual migraine without status migrainosus, not intractable - Prevention, symptomatic care, and red flags reviewed. - Plan: SUMAtriptan (IMITREX) 50 MG tablet  Screening for hyperlipidemia - Plan: Lipid panel  Screening for deficiency anemia - Plan: CBC  Screening for diabetes mellitus - Plan: Comprehensive metabolic panel, Hemoglobin A1c  Screening for breast cancer - Plan: MM 3D SCREEN BREAST BILATERAL  CPE today-   Labs pain as above Referred for routine mammogram Refill migraine medication Will plan further follow- up pending labs.  Signed Lamar Blinks, MD Received her labs 6/2  Results for orders placed or performed in visit on 09/27/18  CBC  Result Value Ref Range   WBC 7.4 4.0 - 10.5 K/uL   RBC 5.06 3.87 - 5.11 Mil/uL   Platelets 213.0 150.0 - 400.0 K/uL   Hemoglobin 13.9 12.0 - 15.0 g/dL   HCT 40.9 36.0 - 46.0 %   MCV 80.7 78.0 - 100.0 fl   MCHC 33.9 30.0 - 36.0 g/dL   RDW 15.3 11.5 - 15.5 %  Comprehensive metabolic panel  Result Value Ref Range   Sodium 137 135 - 145 mEq/L   Potassium 3.7 3.5 - 5.1 mEq/L   Chloride 105 96 - 112 mEq/L   CO2 22 19 - 32 mEq/L   Glucose, Bld 74 70 - 99 mg/dL   BUN 15 6 - 23 mg/dL   Creatinine, Ser 0.74 0.40 - 1.20 mg/dL   Total Bilirubin 0.5 0.2 - 1.2 mg/dL   Alkaline Phosphatase 59 39 - 117 U/L   AST 13 0 - 37 U/L   ALT 11 0 - 35  U/L   Total Protein 7.2 6.0 - 8.3 g/dL   Albumin 4.4 3.5 - 5.2 g/dL   Calcium 9.1 8.4 - 10.5 mg/dL   GFR 84.08 >60.00 mL/min  Hemoglobin A1c  Result Value Ref Range   Hgb A1c MFr Bld 5.7 4.6 - 6.5 %  Lipid panel  Result Value Ref Range   Cholesterol 194 0 - 200 mg/dL   Triglycerides 116.0 0.0 - 149.0 mg/dL   HDL  61.00 >39.00 mg/dL   VLDL 23.2 0.0 - 40.0 mg/dL   LDL Cholesterol 110 (H) 0 - 99 mg/dL   Total CHOL/HDL Ratio 3    NonHDL 132.78

## 2018-09-27 ENCOUNTER — Telehealth: Payer: Self-pay | Admitting: *Deleted

## 2018-09-27 ENCOUNTER — Encounter: Payer: Self-pay | Admitting: Family Medicine

## 2018-09-27 ENCOUNTER — Ambulatory Visit (INDEPENDENT_AMBULATORY_CARE_PROVIDER_SITE_OTHER): Payer: BLUE CROSS/BLUE SHIELD | Admitting: Family Medicine

## 2018-09-27 ENCOUNTER — Other Ambulatory Visit: Payer: Self-pay

## 2018-09-27 VITALS — BP 120/82 | HR 86 | Temp 98.6°F | Ht 64.0 in | Wt 178.1 lb

## 2018-09-27 DIAGNOSIS — Z130 Encounter for screening for diseases of the blood and blood-forming organs and certain disorders involving the immune mechanism: Secondary | ICD-10-CM

## 2018-09-27 DIAGNOSIS — G43829 Menstrual migraine, not intractable, without status migrainosus: Secondary | ICD-10-CM | POA: Diagnosis not present

## 2018-09-27 DIAGNOSIS — Z13220 Encounter for screening for lipoid disorders: Secondary | ICD-10-CM | POA: Diagnosis not present

## 2018-09-27 DIAGNOSIS — Z1239 Encounter for other screening for malignant neoplasm of breast: Secondary | ICD-10-CM

## 2018-09-27 DIAGNOSIS — Z0000 Encounter for general adult medical examination without abnormal findings: Secondary | ICD-10-CM | POA: Diagnosis not present

## 2018-09-27 DIAGNOSIS — Z131 Encounter for screening for diabetes mellitus: Secondary | ICD-10-CM

## 2018-09-27 MED ORDER — SUMATRIPTAN SUCCINATE 50 MG PO TABS: 50.0000 mg | ORAL_TABLET | ORAL | 5 refills | Status: DC | PRN

## 2018-09-27 NOTE — Telephone Encounter (Signed)
Spoke with pt and advised her of office precautions. Pt voices understanding and willing to proceed with appointment as scheduled.

## 2018-09-27 NOTE — Telephone Encounter (Signed)
Copied from [place] [phone]. Topic: General - Other >> Sep 27, 2018  8:24 AM Jodie Echevaria wrote: Reason for CRM: Patient called to ask what the procedure is when a patient come in to see the doctor. She is aprhensive about coming to the office and having to be in the waiting room. Asking for a call back please.  Ph# [phone]

## 2018-09-27 NOTE — Patient Instructions (Addendum)
It was good to see you today!   I will be in touch with your labs asap We will start routine colon cancer screening at age 47 or sooner if you like We will set up your screening mammogram for you- at the breast center of [place] Maintenance, Female Adopting a healthy lifestyle and getting preventive care can go a long way to promote health and wellness. Talk with your health care provider about what schedule of regular examinations is right for you. This is a good chance for you to check in with your provider about disease prevention and staying healthy. In between checkups, there are plenty of things you can do on your own. Experts have done a lot of research about which lifestyle changes and preventive measures are most likely to keep you healthy. Ask your health care provider for more information. Weight and diet Eat a healthy diet  Be sure to include plenty of vegetables, fruits, low-fat dairy products, and lean protein.  Do not eat a lot of foods high in solid fats, added sugars, or salt.  Get regular exercise. This is one of the most important things you can do for your health. ? Most adults should exercise for at least 150 minutes each week. The exercise should increase your heart rate and make you sweat (moderate-intensity exercise). ? Most adults should also do strengthening exercises at least twice a week. This is in addition to the moderate-intensity exercise. Maintain a healthy weight  Body mass index (BMI) is a measurement that can be used to identify possible weight problems. It estimates body fat based on height and weight. Your health care provider can help determine your BMI and help you achieve or maintain a healthy weight.  For females 95 years of age and older: ? A BMI below 18.5 is considered underweight. ? A BMI of 18.5 to 24.9 is normal. ? A BMI of 25 to 29.9 is considered overweight. ? A BMI of 30 and above is considered obese. Watch levels of  cholesterol and blood lipids  You should start having your blood tested for lipids and cholesterol at 47 years of age, then have this test every 5 years.  You may need to have your cholesterol levels checked more often if: ? Your lipid or cholesterol levels are high. ? You are older than 47 years of age. ? You are at high risk for heart disease. Cancer screening Lung Cancer  Lung cancer screening is recommended for adults 58-81 years old who are at high risk for lung cancer because of a history of smoking.  A yearly low-dose CT scan of the lungs is recommended for people who: ? Currently smoke. ? Have quit within the past 15 years. ? Have at least a 30-pack-year history of smoking. A pack year is smoking an average of one pack of cigarettes a day for 1 year.  Yearly screening should continue until it has been 15 years since you quit.  Yearly screening should stop if you develop a health problem that would prevent you from having lung cancer treatment. Breast Cancer  Practice breast self-awareness. This means understanding how your breasts normally appear and feel.  It also means doing regular breast self-exams. Let your health care provider know about any changes, no matter how small.  If you are in your 20s or 30s, you should have a clinical breast exam (CBE) by a health care provider every 1-3 years as part of a regular health  exam.  If you are 40 or older, have a CBE every year. Also consider having a breast X-ray (mammogram) every year.  If you have a family history of breast cancer, talk to your health care provider about genetic screening.  If you are at high risk for breast cancer, talk to your health care provider about having an MRI and a mammogram every year.  Breast cancer gene (BRCA) assessment is recommended for women who have family members with BRCA-related cancers. BRCA-related cancers include: ? Breast. ? Ovarian. ? Tubal. ? Peritoneal cancers.  Results of  the assessment will determine the need for genetic counseling and BRCA1 and BRCA2 testing. Cervical Cancer Your health care provider may recommend that you be screened regularly for cancer of the pelvic organs (ovaries, uterus, and vagina). This screening involves a pelvic examination, including checking for microscopic changes to the surface of your cervix (Pap test). You may be encouraged to have this screening done every 3 years, beginning at age 60.  For women ages 65-65, health care providers may recommend pelvic exams and Pap testing every 3 years, or they may recommend the Pap and pelvic exam, combined with testing for human papilloma virus (HPV), every 5 years. Some types of HPV increase your risk of cervical cancer. Testing for HPV may also be done on women of any age with unclear Pap test results.  Other health care providers may not recommend any screening for nonpregnant women who are considered low risk for pelvic cancer and who do not have symptoms. Ask your health care provider if a screening pelvic exam is right for you.  If you have had past treatment for cervical cancer or a condition that could lead to cancer, you need Pap tests and screening for cancer for at least 20 years after your treatment. If Pap tests have been discontinued, your risk factors (such as having a new sexual partner) need to be reassessed to determine if screening should resume. Some women have medical problems that increase the chance of getting cervical cancer. In these cases, your health care provider may recommend more frequent screening and Pap tests. Colorectal Cancer  This type of cancer can be detected and often prevented.  Routine colorectal cancer screening usually begins at 47 years of age and continues through 47 years of age.  Your health care provider may recommend screening at an earlier age if you have risk factors for colon cancer.  Your health care provider may also recommend using home test  kits to check for hidden blood in the stool.  A small camera at the end of a tube can be used to examine your colon directly (sigmoidoscopy or colonoscopy). This is done to check for the earliest forms of colorectal cancer.  Routine screening usually begins at age 69.  Direct examination of the colon should be repeated every 5-10 years through 47 years of age. However, you may need to be screened more often if early forms of precancerous polyps or small growths are found. Skin Cancer  Check your skin from head to toe regularly.  Tell your health care provider about any new moles or changes in moles, especially if there is a change in a mole's shape or color.  Also tell your health care provider if you have a mole that is larger than the size of a pencil eraser.  Always use sunscreen. Apply sunscreen liberally and repeatedly throughout the day.  Protect yourself by wearing long sleeves, pants, a wide-brimmed hat, and sunglasses  whenever you are outside. Heart disease, diabetes, and high blood pressure  High blood pressure causes heart disease and increases the risk of stroke. High blood pressure is more likely to develop in: ? People who have blood pressure in the high end of the normal range (130-139/85-89 mm Hg). ? People who are overweight or obese. ? People who are African American.  If you are 57-23 years of age, have your blood pressure checked every 3-5 years. If you are 81 years of age or older, have your blood pressure checked every year. You should have your blood pressure measured twice-once when you are at a hospital or clinic, and once when you are not at a hospital or clinic. Record the average of the two measurements. To check your blood pressure when you are not at a hospital or clinic, you can use: ? An automated blood pressure machine at a pharmacy. ? A home blood pressure monitor.  If you are between 6 years and 64 years old, ask your health care provider if you should  take aspirin to prevent strokes.  Have regular diabetes screenings. This involves taking a blood sample to check your fasting blood sugar level. ? If you are at a normal weight and have a low risk for diabetes, have this test once every three years after 47 years of age. ? If you are overweight and have a high risk for diabetes, consider being tested at a younger age or more often. Preventing infection Hepatitis B  If you have a higher risk for hepatitis B, you should be screened for this virus. You are considered at high risk for hepatitis B if: ? You were born in a country where hepatitis B is common. Ask your health care provider which countries are considered high risk. ? Your parents were born in a high-risk country, and you have not been immunized against hepatitis B (hepatitis B vaccine). ? You have HIV or AIDS. ? You use needles to inject street drugs. ? You live with someone who has hepatitis B. ? You have had sex with someone who has hepatitis B. ? You get hemodialysis treatment. ? You take certain medicines for conditions, including cancer, organ transplantation, and autoimmune conditions. Hepatitis C  Blood testing is recommended for: ? Everyone born from [birth year] through 1964. ? Anyone with known risk factors for hepatitis C. Sexually transmitted infections (STIs)  You should be screened for sexually transmitted infections (STIs) including gonorrhea and chlamydia if: ? You are sexually active and are younger than 47 years of age. ? You are older than 47 years of age and your health care provider tells you that you are at risk for this type of infection. ? Your sexual activity has changed since you were last screened and you are at an increased risk for chlamydia or gonorrhea. Ask your health care provider if you are at risk.  If you do not have HIV, but are at risk, it may be recommended that you take a prescription medicine daily to prevent HIV infection. This is called  pre-exposure prophylaxis (PrEP). You are considered at risk if: ? You are sexually active and do not regularly use condoms or know the HIV status of your partner(s). ? You take drugs by injection. ? You are sexually active with a partner who has HIV. Talk with your health care provider about whether you are at high risk of being infected with HIV. If you choose to begin PrEP, you should first be tested for  HIV. You should then be tested every 3 months for as long as you are taking PrEP. Pregnancy  If you are premenopausal and you may become pregnant, ask your health care provider about preconception counseling.  If you may become pregnant, take 400 to 800 micrograms (mcg) of folic acid every day.  If you want to prevent pregnancy, talk to your health care provider about birth control (contraception). Osteoporosis and menopause  Osteoporosis is a disease in which the bones lose minerals and strength with aging. This can result in serious bone fractures. Your risk for osteoporosis can be identified using a bone density scan.  If you are 70 years of age or older, or if you are at risk for osteoporosis and fractures, ask your health care provider if you should be screened.  Ask your health care provider whether you should take a calcium or vitamin D supplement to lower your risk for osteoporosis.  Menopause may have certain physical symptoms and risks.  Hormone replacement therapy may reduce some of these symptoms and risks. Talk to your health care provider about whether hormone replacement therapy is right for you. Follow these instructions at home:  Schedule regular health, dental, and eye exams.  Stay current with your immunizations.  Do not use any tobacco products including cigarettes, chewing tobacco, or electronic cigarettes.  If you are pregnant, do not drink alcohol.  If you are breastfeeding, limit how much and how often you drink alcohol.  Limit alcohol intake to no more  than 1 drink per day for nonpregnant women. One drink equals 12 ounces of beer, 5 ounces of wine, or 1 ounces of hard liquor.  Do not use street drugs.  Do not share needles.  Ask your health care provider for help if you need support or information about quitting drugs.  Tell your health care provider if you often feel depressed.  Tell your health care provider if you have ever been abused or do not feel safe at home. This information is not intended to replace advice given to you by your health care provider. Make sure you discuss any questions you have with your health care provider. Document Released: 10/28/2010 Document Revised: 09/20/2015 Document Reviewed: 01/16/2015 Elsevier Interactive Patient Education  2019 [employer].

## 2018-09-28 ENCOUNTER — Encounter: Payer: Self-pay | Admitting: Family Medicine

## 2018-09-28 LAB — LIPID PANEL
Cholesterol: 194 mg/dL (ref 0–200)
HDL: 61 mg/dL (ref >39.00)
LDL Cholesterol: 110 mg/dL — ABNORMAL HIGH (ref 0–99)
NonHDL: 132.78
Total CHOL/HDL Ratio: 3
Triglycerides: 116 mg/dL (ref 0.0–149.0)
VLDL: 23.2 mg/dL (ref 0.0–40.0)

## 2018-09-28 LAB — COMPREHENSIVE METABOLIC PANEL
ALT: 11 U/L (ref 0–35)
AST: 13 U/L (ref 0–37)
Albumin: 4.4 g/dL (ref 3.5–5.2)
Alkaline Phosphatase: 59 U/L (ref 39–117)
BUN: 15 mg/dL (ref 6–23)
CO2: 22 mEq/L (ref 19–32)
Calcium: 9.1 mg/dL (ref 8.4–10.5)
Chloride: 105 mEq/L (ref 96–112)
Creatinine, Ser: 0.74 mg/dL (ref 0.40–1.20)
GFR: 84.08 mL/min (ref >60.00)
Glucose, Bld: 74 mg/dL (ref 70–99)
Potassium: 3.7 mEq/L (ref 3.5–5.1)
Sodium: 137 mEq/L (ref 135–145)
Total Bilirubin: 0.5 mg/dL (ref 0.2–1.2)
Total Protein: 7.2 g/dL (ref 6.0–8.3)

## 2018-09-28 LAB — CBC
HCT: 40.9 % (ref 36.0–46.0)
Hemoglobin: 13.9 g/dL (ref 12.0–15.0)
MCHC: 33.9 g/dL (ref 30.0–36.0)
MCV: 80.7 fl (ref 78.0–100.0)
Platelets: 213 10*3/uL (ref 150.0–400.0)
RBC: 5.06 Mil/uL (ref 3.87–5.11)
RDW: 15.3 % (ref 11.5–15.5)
WBC: 7.4 10*3/uL (ref 4.0–10.5)

## 2018-09-28 LAB — HEMOGLOBIN A1C: Hgb A1c MFr Bld: 5.7 % (ref 4.6–6.5)

## 2019-05-30 ENCOUNTER — Other Ambulatory Visit: Payer: Self-pay | Admitting: Family Medicine

## 2019-05-30 DIAGNOSIS — G43829 Menstrual migraine, not intractable, without status migrainosus: Secondary | ICD-10-CM

## 2019-09-24 NOTE — Progress Notes (Addendum)
[place] [facility] [address], [place], [place] [address] [phone]  Date:  09/28/2019   Name:  Sara Reynolds   DOB:  1971/01/15   MRN:  [id number]  PCP:  Darreld Mclean, MD    Chief Complaint: Annual Exam (med refill imitrex)   History of Present Illness:  Sara Reynolds is a 49 y.o. 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  Her oldest does have high functioning autism.  He is now 62 and just graduated from [employer].  They are not sure what the next step will be for him.  He might want to try community college.  Her 48 year old and 48 year old are doing okay, her 48 year old did struggle quite a bit with online learning last year and will probably have to repeat 10th grade  2 years ago she had a mass on her chest wall, biopsy showed benign findings.  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  No symptoms. Will order cologuard -she will make sure her insurance covers this test Labs 1 year ago- she is fasting today Never smoker   She enjoys yard work for exercise She has lost some weight!   Wt Readings from Last 3 Encounters:  09/28/19 170 lb (77.1 kg)  09/27/18 178 lb 2 oz (80.8 kg)  08/24/17 168 lb (76.2 kg)    Patient Active Problem List   Diagnosis Date Noted  . Breast mass, right 05/14/2015  . Soft tissue mass 11/03/2014  . Depression 12/09/2012    Past Medical History:  Diagnosis Date  . Allergy   . Chicken pox   . Hay fever   . Thyroid goiter      Past Surgical History:  Procedure Laterality Date  . CESAREAN SECTION      Social History   Tobacco Use  . Smoking status: Never Smoker  . Smokeless tobacco: Never Used  Substance Use Topics  . Alcohol use: Yes    Comment: social  . Drug use: No    Family History  Problem Relation Age of Onset  . Mental illness Mother   . Hypertension Father   . Diabetes Father   . Kidney disease Maternal Grandmother   . Hyperlipidemia Maternal Grandfather   . Hypertension Paternal Grandmother   . Diabetes Paternal Grandmother   . Stroke Paternal Grandfather   . Hypertension Paternal Grandfather   . Diabetes Paternal Grandfather     No Known Allergies  Medication list has been reviewed and updated.  Current Outpatient Medications on File Prior to Visit  Medication Sig Dispense Refill  . SUMAtriptan (IMITREX) 50 MG tablet TAKE 1 TABLET EVERY 2 HRS AS NEEDED FOR MIGRAINE. MAY REPEAT IN 2 HRS IF H/A PERSISTS. MAX 2/24HRS 10 tablet 5  . albuterol (PROVENTIL HFA;VENTOLIN HFA) 108 (90 Base) MCG/ACT inhaler Inhale 2 puffs into the lungs every 6 (six) hours as needed for wheezing or shortness of breath. (Patient not taking: Reported on 09/28/2019) 1 Inhaler 0  No current facility-administered medications on file prior to visit.    Review of Systems:  As per HPI- otherwise negative.    Physical Examination: Vitals:   09/28/19 0906  BP: 122/80  Pulse: 78  Resp: 17  SpO2: 97%   Vitals:   09/28/19 0906  Weight: 170 lb (77.1 kg)  Height: 5' 4"  (1.626 m)   Body mass index is 29.18 kg/m. Ideal Body Weight: Weight in (lb) to have BMI = 25: 145.3  GEN: no acute distress.  Overweight, looks well HEENT: Atraumatic, Normocephalic.   Bilateral TM wnl, oropharynx normal.  PEERL,EOMI.   Ears and Nose: No external deformity. CV: RRR, No M/G/R. No JVD. No thrill. No extra heart sounds. PULM: CTA B, no wheezes, crackles, rhonchi. No retractions. No resp. distress. No accessory muscle  use. ABD: S, NT, ND, +BS. No rebound. No HSM. EXTR: No c/c/e PSYCH: Normally interactive. Conversant.    Assessment and Plan: Physical exam  Screening for hyperlipidemia - Plan: Lipid panel  Screening for deficiency anemia - Plan: CBC  Screening for diabetes mellitus - Plan: Comprehensive metabolic panel, Hemoglobin A1c  Encounter for screening mammogram for malignant neoplasm of breast  Screening for HIV (human immunodeficiency virus) - Plan: HIV Antibody (routine testing w rflx)  Screening for thyroid disorder - Plan: TSH  Menstrual migraine without status migrainosus, not intractable - Prevention, symptomatic care, and red flags reviewed. - Plan: SUMAtriptan (IMITREX) 50 MG tablet  Screening for colon cancer  Encounter for screening mammogram for breast cancer - Plan: MM 3D SCREEN BREAST BILATERAL  Here today for routine physical exam.  Labs pending as above.  Ordered Cologuard for colon cancer screening, encouraged mammogram and ordered this study for her.  Refilled her Imitrex.  Praised her weight loss efforts  Will plan further follow- up pending labs.  Discussed diet, exercise, alcohol and tobacco avoidance  This visit occurred during the SARS-CoV-2 public health emergency.  Safety protocols were in place, including screening questions prior to the visit, additional usage of staff PPE, and extensive cleaning of exam room while observing appropriate contact time as indicated for disinfecting solutions.    Signed Lamar Blinks, MD  Received her labs, message to pt  Results for orders placed or performed in visit on 09/28/19  CBC  Result Value Ref Range   WBC 4.8 4.0 - 10.5 K/uL   RBC 4.93 3.87 - 5.11 Mil/uL   Platelets 224.0 150.0 - 400.0 K/uL   Hemoglobin 13.5 12.0 - 15.0 g/dL   HCT 40.4 36.0 - 46.0 %   MCV 82.0 78.0 - 100.0 fl   MCHC 33.4 30.0 - 36.0 g/dL   RDW 14.8 11.5 - 15.5 %  Comprehensive metabolic panel  Result Value Ref Range   Sodium 136 135 -  145 mEq/L   Potassium 4.0 3.5 - 5.1 mEq/L   Chloride 104 96 - 112 mEq/L   CO2 27 19 - 32 mEq/L   Glucose, Bld 90 70 - 99 mg/dL   BUN 12 6 - 23 mg/dL   Creatinine, Ser 0.77 0.40 - 1.20 mg/dL   Total Bilirubin 0.5 0.2 - 1.2 mg/dL   Alkaline Phosphatase 57 39 - 117 U/L   AST 13 0 - 37 U/L   ALT 9 0 - 35 U/L   Total Protein 6.7 6.0 - 8.3 g/dL   Albumin 4.2 3.5 - 5.2 g/dL   GFR 79.97 >60.00 mL/min   Calcium 8.8 8.4 - 10.5 mg/dL  Hemoglobin A1c  Result Value  Ref Range   Hgb A1c MFr Bld 5.6 4.6 - 6.5 %  Lipid panel  Result Value Ref Range   Cholesterol 159 0 - 200 mg/dL   Triglycerides 84.0 0.0 - 149.0 mg/dL   HDL 53.40 >39.00 mg/dL   VLDL 16.8 0.0 - 40.0 mg/dL   LDL Cholesterol 88 0 - 99 mg/dL   Total CHOL/HDL Ratio 3    NonHDL 105.20   TSH  Result Value Ref Range   TSH 2.07 0.35 - 4.50 uIU/mL

## 2019-09-24 NOTE — Patient Instructions (Addendum)
It was good to see you again today, I will be in touch with your labs as soon as possible We will order a cologuard kit for you- please make sure [employer] will cover prior to doing the kit You can stop by the ground floor imaging dept for a mammogram- hopefully can do today    Health Maintenance, Female Adopting a healthy lifestyle and getting preventive care are important in promoting health and wellness. Ask your health care provider about:  The right schedule for you to have regular tests and exams.  Things you can do on your own to prevent diseases and keep yourself healthy. What should I know about diet, weight, and exercise? Eat a healthy diet   Eat a diet that includes plenty of vegetables, fruits, low-fat dairy products, and lean protein.  Do not eat a lot of foods that are high in solid fats, added sugars, or sodium. Maintain a healthy weight Body mass index (BMI) is used to identify weight problems. It estimates body fat based on height and weight. Your health care provider can help determine your BMI and help you achieve or maintain a healthy weight. Get regular exercise Get regular exercise. This is one of the most important things you can do for your health. Most adults should:  Exercise for at least 150 minutes each week. The exercise should increase your heart rate and make you sweat (moderate-intensity exercise).  Do strengthening exercises at least twice a week. This is in addition to the moderate-intensity exercise.  Spend less time sitting. Even light physical activity can be beneficial. Watch cholesterol and blood lipids Have your blood tested for lipids and cholesterol at 48 years of age, then have this test every 5 years. Have your cholesterol levels checked more often if:  Your lipid or cholesterol levels are high.  You are older than 48 years of age.  You are at high risk for heart disease. What should I know about cancer screening? Depending on your health  history and family history, you may need to have cancer screening at various ages. This may include screening for:  Breast cancer.  Cervical cancer.  Colorectal cancer.  Skin cancer.  Lung cancer. What should I know about heart disease, diabetes, and high blood pressure? Blood pressure and heart disease  High blood pressure causes heart disease and increases the risk of stroke. This is more likely to develop in people who have high blood pressure readings, are of African descent, or are overweight.  Have your blood pressure checked: ? Every 3-5 years if you are 12-104 years of age. ? Every year if you are 31 years old or older. Diabetes Have regular diabetes screenings. This checks your fasting blood sugar level. Have the screening done:  Once every three years after age 38 if you are at a normal weight and have a low risk for diabetes.  More often and at a younger age if you are overweight or have a high risk for diabetes. What should I know about preventing infection? Hepatitis B If you have a higher risk for hepatitis B, you should be screened for this virus. Talk with your health care provider to find out if you are at risk for hepatitis B infection. Hepatitis C Testing is recommended for:  Everyone born from [birth year] through 1964.  Anyone with known risk factors for hepatitis C. Sexually transmitted infections (STIs)  Get screened for STIs, including gonorrhea and chlamydia, if: ? You are sexually active and are younger  than 48 years of age. ? You are older than 48 years of age and your health care provider tells you that you are at risk for this type of infection. ? Your sexual activity has changed since you were last screened, and you are at increased risk for chlamydia or gonorrhea. Ask your health care provider if you are at risk.  Ask your health care provider about whether you are at high risk for HIV. Your health care provider may recommend a prescription medicine to  help prevent HIV infection. If you choose to take medicine to prevent HIV, you should first get tested for HIV. You should then be tested every 3 months for as long as you are taking the medicine. Pregnancy  If you are about to stop having your period (premenopausal) and you may become pregnant, seek counseling before you get pregnant.  Take 400 to 800 micrograms (mcg) of folic acid every day if you become pregnant.  Ask for birth control (contraception) if you want to prevent pregnancy. Osteoporosis and menopause Osteoporosis is a disease in which the bones lose minerals and strength with aging. This can result in bone fractures. If you are 47 years old or older, or if you are at risk for osteoporosis and fractures, ask your health care provider if you should:  Be screened for bone loss.  Take a calcium or vitamin D supplement to lower your risk of fractures.  Be given hormone replacement therapy (HRT) to treat symptoms of menopause. Follow these instructions at home: Lifestyle  Do not use any products that contain nicotine or tobacco, such as cigarettes, e-cigarettes, and chewing tobacco. If you need help quitting, ask your health care provider.  Do not use street drugs.  Do not share needles.  Ask your health care provider for help if you need support or information about quitting drugs. Alcohol use  Do not drink alcohol if: ? Your health care provider tells you not to drink. ? You are pregnant, may be pregnant, or are planning to become pregnant.  If you drink alcohol: ? Limit how much you use to 0-1 drink a day. ? Limit intake if you are breastfeeding.  Be aware of how much alcohol is in your drink. In the [place] one drink equals one 12 oz bottle of beer (355 mL), one 5 oz glass of wine (148 mL), or one 1 oz glass of hard liquor (44 mL). General instructions  Schedule regular health, dental, and eye exams.  Stay current with your vaccines.  Tell your health care  provider if: ? You often feel depressed. ? You have ever been abused or do not feel safe at home. Summary  Adopting a healthy lifestyle and getting preventive care are important in promoting health and wellness.  Follow your health care provider's instructions about healthy diet, exercising, and getting tested or screened for diseases.  Follow your health care provider's instructions on monitoring your cholesterol and blood pressure. This information is not intended to replace advice given to you by your health care provider. Make sure you discuss any questions you have with your health care provider. Document Revised: 04/07/2018 Document Reviewed: 04/07/2018 Elsevier Patient Education  2020 [employer].

## 2019-09-28 ENCOUNTER — Encounter: Payer: Self-pay | Admitting: Family Medicine

## 2019-09-28 ENCOUNTER — Ambulatory Visit (INDEPENDENT_AMBULATORY_CARE_PROVIDER_SITE_OTHER): Payer: BC Managed Care – PPO | Admitting: Family Medicine

## 2019-09-28 ENCOUNTER — Other Ambulatory Visit: Payer: Self-pay

## 2019-09-28 VITALS — BP 122/80 | HR 78 | Resp 17 | Ht 64.0 in | Wt 170.0 lb

## 2019-09-28 DIAGNOSIS — Z114 Encounter for screening for human immunodeficiency virus [HIV]: Secondary | ICD-10-CM

## 2019-09-28 DIAGNOSIS — Z1211 Encounter for screening for malignant neoplasm of colon: Secondary | ICD-10-CM

## 2019-09-28 DIAGNOSIS — Z1329 Encounter for screening for other suspected endocrine disorder: Secondary | ICD-10-CM

## 2019-09-28 DIAGNOSIS — Z131 Encounter for screening for diabetes mellitus: Secondary | ICD-10-CM

## 2019-09-28 DIAGNOSIS — Z13220 Encounter for screening for lipoid disorders: Secondary | ICD-10-CM

## 2019-09-28 DIAGNOSIS — Z1231 Encounter for screening mammogram for malignant neoplasm of breast: Secondary | ICD-10-CM

## 2019-09-28 DIAGNOSIS — Z0000 Encounter for general adult medical examination without abnormal findings: Secondary | ICD-10-CM

## 2019-09-28 DIAGNOSIS — Z130 Encounter for screening for diseases of the blood and blood-forming organs and certain disorders involving the immune mechanism: Secondary | ICD-10-CM | POA: Diagnosis not present

## 2019-09-28 DIAGNOSIS — G43829 Menstrual migraine, not intractable, without status migrainosus: Secondary | ICD-10-CM

## 2019-09-28 LAB — CBC
HCT: 40.4 % (ref 36.0–46.0)
Hemoglobin: 13.5 g/dL (ref 12.0–15.0)
MCHC: 33.4 g/dL (ref 30.0–36.0)
MCV: 82 fl (ref 78.0–100.0)
Platelets: 224 10*3/uL (ref 150.0–400.0)
RBC: 4.93 Mil/uL (ref 3.87–5.11)
RDW: 14.8 % (ref 11.5–15.5)
WBC: 4.8 10*3/uL (ref 4.0–10.5)

## 2019-09-28 LAB — COMPREHENSIVE METABOLIC PANEL
ALT: 9 U/L (ref 0–35)
AST: 13 U/L (ref 0–37)
Albumin: 4.2 g/dL (ref 3.5–5.2)
Alkaline Phosphatase: 57 U/L (ref 39–117)
BUN: 12 mg/dL (ref 6–23)
CO2: 27 mEq/L (ref 19–32)
Calcium: 8.8 mg/dL (ref 8.4–10.5)
Chloride: 104 mEq/L (ref 96–112)
Creatinine, Ser: 0.77 mg/dL (ref 0.40–1.20)
GFR: 79.97 mL/min (ref >60.00)
Glucose, Bld: 90 mg/dL (ref 70–99)
Potassium: 4 mEq/L (ref 3.5–5.1)
Sodium: 136 mEq/L (ref 135–145)
Total Bilirubin: 0.5 mg/dL (ref 0.2–1.2)
Total Protein: 6.7 g/dL (ref 6.0–8.3)

## 2019-09-28 LAB — LIPID PANEL
Cholesterol: 159 mg/dL (ref 0–200)
HDL: 53.4 mg/dL (ref >39.00)
LDL Cholesterol: 88 mg/dL (ref 0–99)
NonHDL: 105.2
Total CHOL/HDL Ratio: 3
Triglycerides: 84 mg/dL (ref 0.0–149.0)
VLDL: 16.8 mg/dL (ref 0.0–40.0)

## 2019-09-28 LAB — HEMOGLOBIN A1C: Hgb A1c MFr Bld: 5.6 % (ref 4.6–6.5)

## 2019-09-28 LAB — TSH: TSH: 2.07 u[IU]/mL (ref 0.35–4.50)

## 2019-09-28 MED ORDER — SUMATRIPTAN SUCCINATE 50 MG PO TABS: ORAL_TABLET | ORAL | 5 refills | Status: DC

## 2019-09-28 NOTE — Progress Notes (Signed)
Cologuard ordered for patient.

## 2019-09-29 LAB — HIV ANTIBODY (ROUTINE TESTING W REFLEX): HIV 1&2 Ab, 4th Generation: NONREACTIVE

## 2019-10-06 ENCOUNTER — Ambulatory Visit (HOSPITAL_BASED_OUTPATIENT_CLINIC_OR_DEPARTMENT_OTHER)
Admission: RE | Admit: 2019-10-06 | Discharge: 2019-10-06 | Disposition: A | Discharge status: Home / Self Care | Payer: BC Managed Care – PPO | Source: Ambulatory Visit | Attending: Family Medicine | Admitting: Family Medicine

## 2019-10-06 ENCOUNTER — Other Ambulatory Visit: Payer: Self-pay

## 2019-10-06 DIAGNOSIS — Z1231 Encounter for screening mammogram for malignant neoplasm of breast: Secondary | ICD-10-CM | POA: Diagnosis not present

## 2019-10-12 ENCOUNTER — Other Ambulatory Visit: Payer: Self-pay | Admitting: Family Medicine

## 2019-10-12 DIAGNOSIS — R928 Other abnormal and inconclusive findings on diagnostic imaging of breast: Secondary | ICD-10-CM

## 2019-10-20 ENCOUNTER — Other Ambulatory Visit: Payer: Self-pay

## 2019-10-20 ENCOUNTER — Ambulatory Visit
Admission: RE | Admit: 2019-10-20 | Discharge: 2019-10-20 | Disposition: A | Discharge status: Home / Self Care | Payer: BC Managed Care – PPO | Source: Ambulatory Visit | Attending: Family Medicine | Admitting: Family Medicine

## 2019-10-20 DIAGNOSIS — R922 Inconclusive mammogram: Secondary | ICD-10-CM | POA: Diagnosis not present

## 2019-10-20 DIAGNOSIS — R928 Other abnormal and inconclusive findings on diagnostic imaging of breast: Secondary | ICD-10-CM

## 2019-10-20 DIAGNOSIS — N6001 Solitary cyst of right breast: Secondary | ICD-10-CM | POA: Diagnosis not present

## 2019-11-29 DIAGNOSIS — Z20822 Contact with and (suspected) exposure to covid-19: Secondary | ICD-10-CM | POA: Diagnosis not present

## 2019-11-29 DIAGNOSIS — Z03818 Encounter for observation for suspected exposure to other biological agents ruled out: Secondary | ICD-10-CM | POA: Diagnosis not present

## 2020-03-13 DIAGNOSIS — Z1211 Encounter for screening for malignant neoplasm of colon: Secondary | ICD-10-CM | POA: Diagnosis not present

## 2020-03-22 LAB — COLOGUARD
COLOGUARD: NEGATIVE
Cologuard: NEGATIVE

## 2020-03-22 LAB — EXTERNAL GENERIC LAB PROCEDURE: COLOGUARD: NEGATIVE

## 2020-03-26 ENCOUNTER — Other Ambulatory Visit: Payer: Self-pay

## 2020-03-26 DIAGNOSIS — Z1211 Encounter for screening for malignant neoplasm of colon: Secondary | ICD-10-CM

## 2020-03-26 NOTE — Addendum Note (Signed)
Addended by: Gerilyn Nestle on: 03/26/2020 08:23 AM   Modules accepted: Orders

## 2020-06-01 IMAGING — US US BREAST BX W LOC DEV 1ST LESION IMG BX SPEC US GUIDE*R*
1 series · 10 of 10 positions shown · non-contrast
Comparison: Previous exam(s).

ADDENDUM:
Pathology revealed ECCRINE SPIRADENOMA of RIGHT breast, lateral
chest wall, 8:30 o'clock. This was found to be concordant by Dr.
Jhemboy Padam.

Pathology results were discussed with the patient by telephone. The
patient reported doing well after the biopsy with tenderness at the
site. Post biopsy instructions and care were reviewed and questions
were answered. The patient was encouraged to call The [REDACTED]
The patient was instructed to continue with monthly self breast
examinations, clinical follow-up as needed, and to return for annual
mammography in one year. The patient was informed a reminder notice
would be sent regarding this appointment.
Pathology results reported by Kem Gore, RN on 01/22/2018.
CLINICAL DATA: 47-year-old female presenting for ultrasound-guided
biopsy of a right lateral breast/chest wall mass.
EXAM:
ULTRASOUND GUIDED RIGHT BREAST CORE NEEDLE BIOPSY

[Series 1: us breast bx w loc dev 1st lesion img bx spec us g · 0.05mm/px · 10 of 10 slices shown]
[im 1/10]
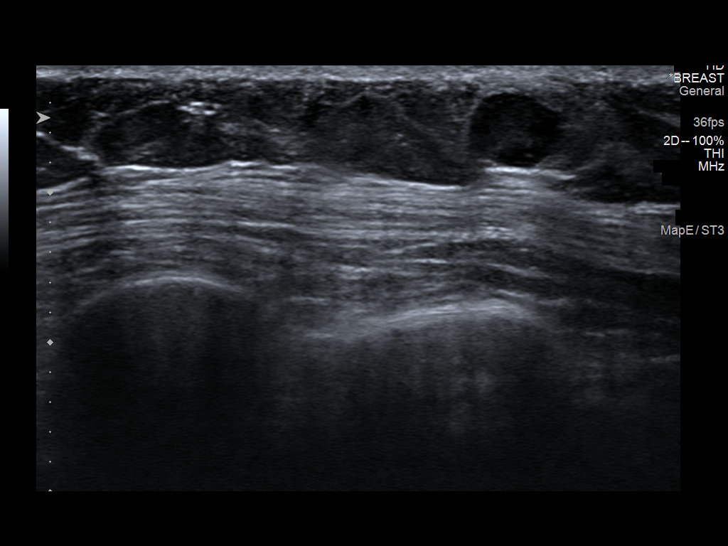
[im 2/10]
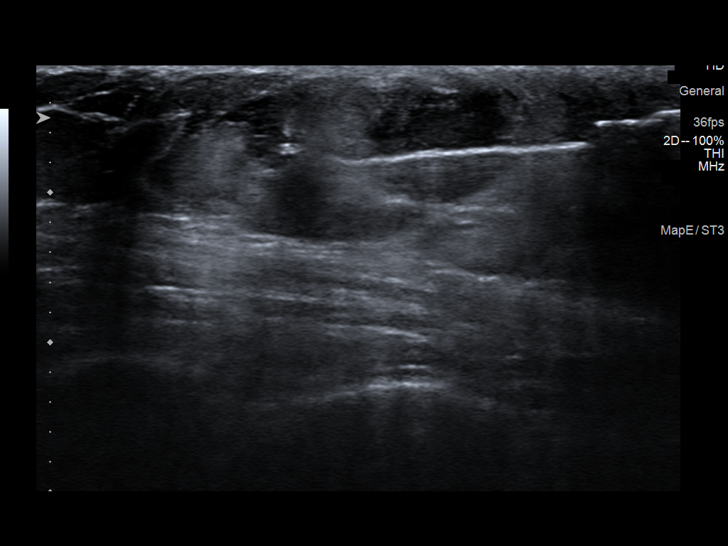
[im 3/10]
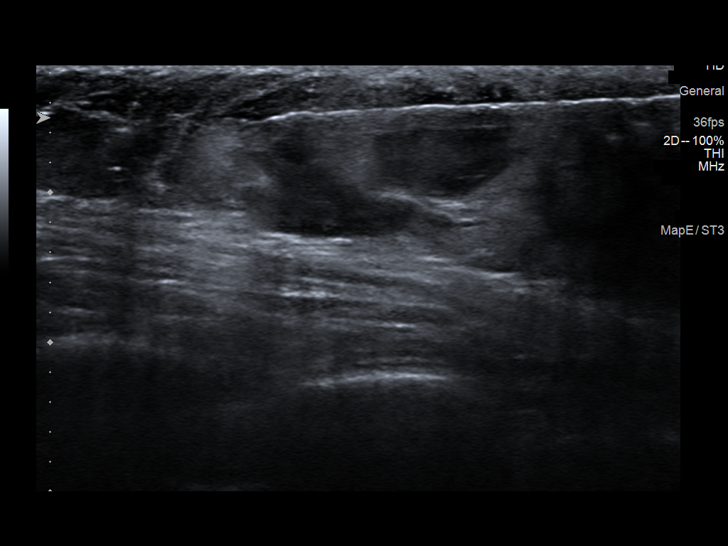
[im 4/10]
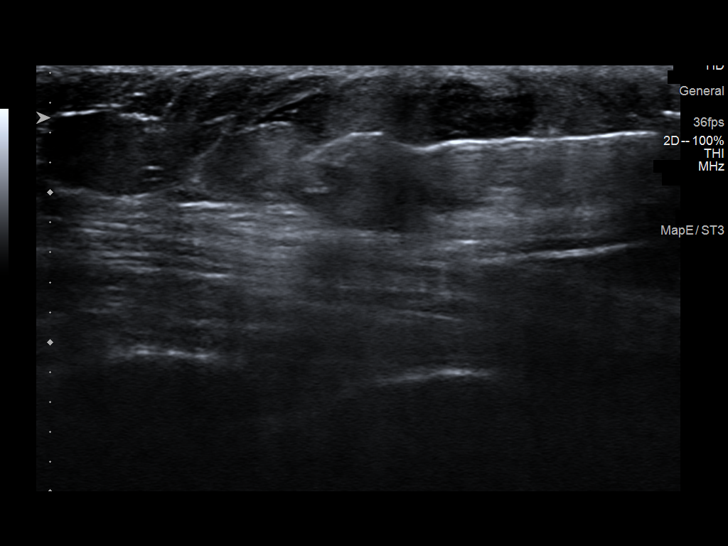
[im 5/10]
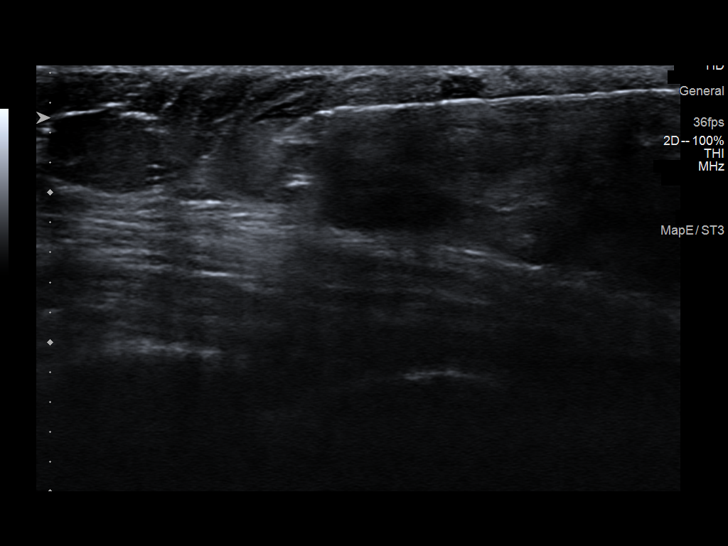
[im 6/10]
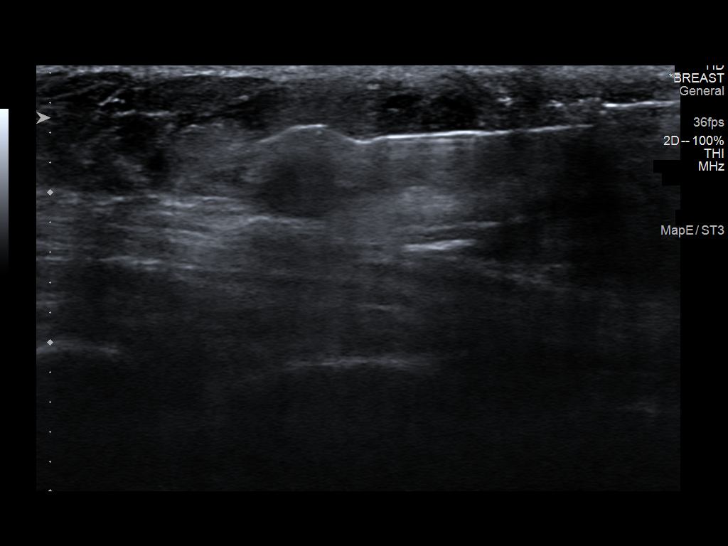
[im 7/10]
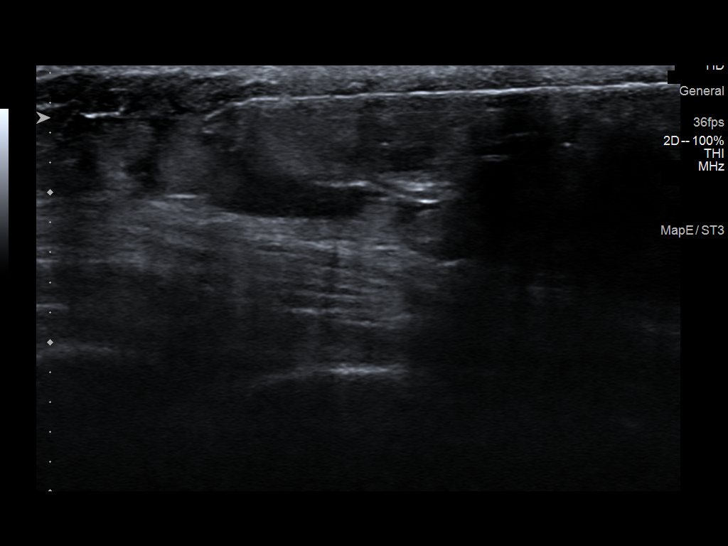
[im 8/10]
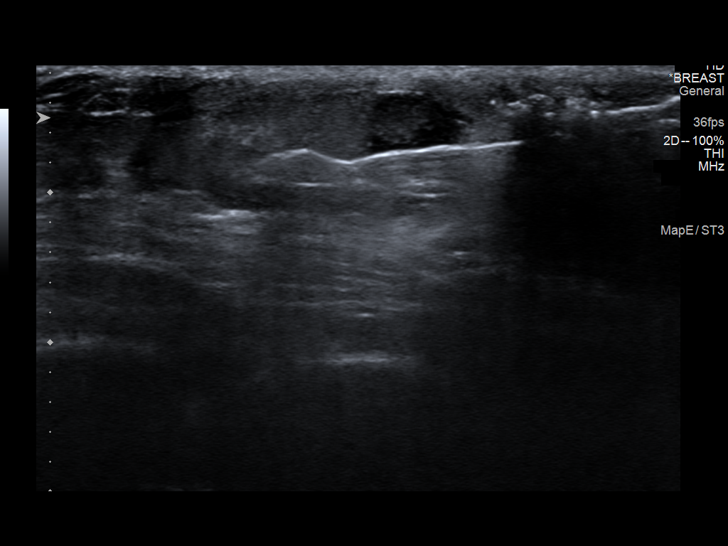
[im 9/10]
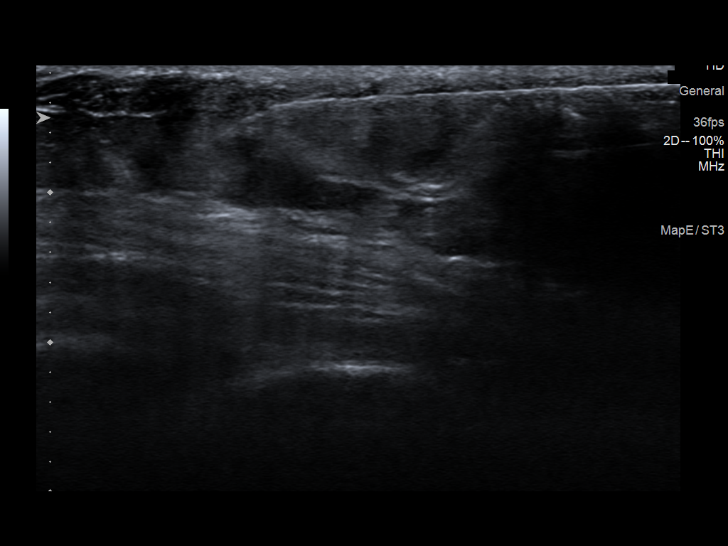
[im 10/10]
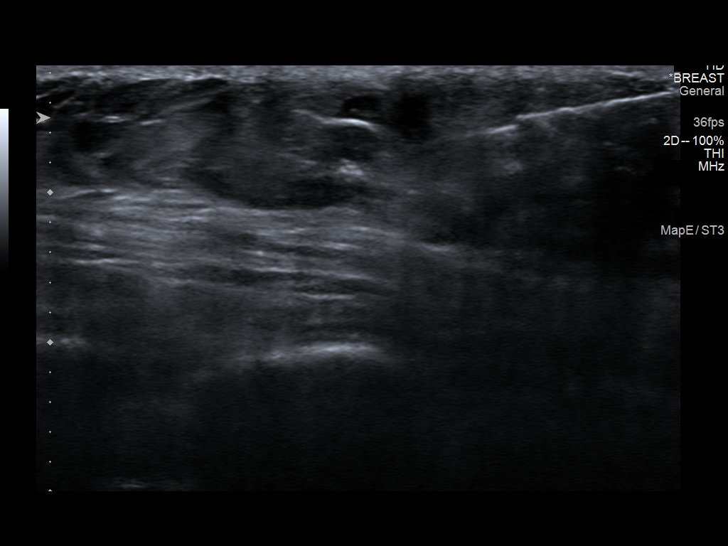

[10 of 10 positions shown; findings below may reference images not displayed]



Lesion quadrant: Lower outer quadrant

Using sterile technique and 1% Lidocaine as local anesthetic, under
direct ultrasound visualization, a 14 gauge Coston device was
used to perform biopsy of a mass along the lateral right chest wall
using an inferior approach. At the conclusion of the procedure a
HydroMARK spiral shaped tissue marker clip was deployed into the
biopsy cavity.
IMPRESSION: Ultrasound guided biopsy of a lateral breast/chest wall mass at
[DATE]. No apparent complications.

## 2020-06-14 ENCOUNTER — Other Ambulatory Visit: Payer: Self-pay | Admitting: Family Medicine

## 2020-06-14 DIAGNOSIS — G43829 Menstrual migraine, not intractable, without status migrainosus: Secondary | ICD-10-CM

## 2020-09-04 ENCOUNTER — Other Ambulatory Visit: Payer: Self-pay

## 2020-09-04 ENCOUNTER — Encounter: Payer: Self-pay | Admitting: Family Medicine

## 2020-09-04 ENCOUNTER — Telehealth (INDEPENDENT_AMBULATORY_CARE_PROVIDER_SITE_OTHER): Payer: BC Managed Care – PPO | Admitting: Family Medicine

## 2020-09-04 DIAGNOSIS — G43829 Menstrual migraine, not intractable, without status migrainosus: Secondary | ICD-10-CM

## 2020-09-04 MED ORDER — ONDANSETRON 4 MG PO TBDP: 4.0000 mg | ORAL_TABLET | Freq: Three times a day (TID) | ORAL | 0 refills | Status: DC | PRN

## 2020-09-04 MED ORDER — SUMATRIPTAN SUCCINATE 50 MG PO TABS: ORAL_TABLET | ORAL | 5 refills | Status: DC

## 2020-09-04 MED ORDER — KETOROLAC TROMETHAMINE 60 MG/2ML IM SOLN
60.0000 mg | Freq: Once | INTRAMUSCULAR | Status: AC
  Administered 2020-09-04: 60 mg via INTRAMUSCULAR

## 2020-09-04 NOTE — Addendum Note (Signed)
Addended by: Sharon Seller B on: 09/04/2020 11:15 AM   Modules accepted: Orders

## 2020-09-04 NOTE — Progress Notes (Signed)
Chief Complaint  Patient presents with  . Migraine     Sara Reynolds is a 50 y.o. female here for evaluation of an acute headache. Due to COVID-19 pandemic, we are interacting via web portal for an electronic face-to-face visit. I verified patient's ID using 2 identifiers. Patient agreed to proceed with visit via this method. Patient is at home, I am at home. Patient and I are present for visit.    Duration: 1 day Laterality: bilateral frontal Quality: Aching Severity: 9/10 Associated symptoms: N/V, light/sound sensitivity Therapies tried: Ibuprofen, Tylenol; unfortunately ran out of the Imitrex, needs refill Hx of migraines: Yes.  Past Medical History:  Diagnosis Date  . Allergy   . Chicken pox   . Hay fever   . Thyroid goiter    Allergies as of 09/04/2020   No Known Allergies     Medication List       Accurate as of Sep 04, 2020 10:51 AM. If you have any questions, ask your nurse or doctor.        albuterol 108 (90 Base) MCG/ACT inhaler Commonly known as: VENTOLIN HFA Inhale 2 puffs into the lungs every 6 (six) hours as needed for wheezing or shortness of breath.   ondansetron 4 MG disintegrating tablet Commonly known as: ZOFRAN-ODT Take 1 tablet (4 mg total) by mouth every 8 (eight) hours as needed for nausea or vomiting. Started by: Shelda Pal, DO   SUMAtriptan 50 MG tablet Commonly known as: IMITREX TAKE 1 TABLET BY MOUTH EVERY 2 HOURS AS NEEDED FOR MIGRAINE. MAY REPEAT 1 TABLET IN 2 HOURS IF HEADACHE PERSISTS. MAX OF 2 TABLETS IN 24 HOURS.      Objective No conversational dyspnea Age appropriate judgment and insight Nml affect and mood  Menstrual migraine without status migrainosus, not intractable - Prevention, symptomatic care, and red flags reviewed. - Plan: SUMAtriptan (IMITREX) 50 MG tablet, ondansetron (ZOFRAN-ODT) 4 MG disintegrating tablet  Chronic, exacerbation. Refill Imitrex. Start Zofran prn. She will come into office for  Toradol injection. Her husband will bring her.  F/u prn otherwise. The pt voiced understanding and agreement to the plan.  [place], DO 09/04/20 10:51 AM

## 2020-11-25 NOTE — Progress Notes (Addendum)
[place] at [facility] [place], [place], [place] [address] [phone]  Date:  11/26/2020   Name:  Sara Reynolds   DOB:  04-14-1971   MRN:  [id number]  PCP:  Darreld Mclean, MD    Chief Complaint: Annual Exam   History of Present Illness:  Sara Reynolds is a 50 y.o. very pleasant female patient who presents with the following:  Patient seen today for physical exam Last visit about one year ago She notes possible symptoms of impending menopause; she has noted "brain fog," feeling more emotional at times Her menses are less regular from years prior, but she is still getting more than 6 menstrual periods per year.   However, she also notes significant other factors which could be causing the symptoms Her mother has history of bipolar disorder, her oldest son is autistic, middle son just came out as trans, and her youngest daughter also thinks she may be on the autism spectrum.  Malak notes that growing up she was very introverted, she grew up in poverty and likely has some symptoms of ADHD Pt does not see any sign of bipolar disorder in herself  She is going to a weight loss clinic and they put her on phentermine. In addition to helping with weight control she notes that phentermine really seem to improve her mental functioning.  She is able to be more organized and get things done more easily She has a history of bulemia and notes that phentermine helps to prevent her from purging  She tried wellbutrin and also effexor in the past- however she is not really sure how this worked for her   Pap can be updated- will update today  COVID-19 booster Hepatitis C screening/due for routine labs Cologuard was completed last fall Mammogram 1 year ago-she did have a call back for right breast cyst.  We discussed this today.  Bradlee is a bit frustrated that she has been called back twice for previous mammograms.  I encouraged her  that since we do now have baseline hopefully this will prevent further callbacks I do encourage her to continue routine mammogram Patient Active Problem List   Diagnosis Date Noted   Breast mass, right 05/14/2015   Soft tissue mass 11/03/2014   Depression 12/09/2012    Past Medical History:  Diagnosis Date   Allergy    Chicken pox    Hay fever    Thyroid goiter     Past Surgical History:  Procedure Laterality Date   CESAREAN SECTION      Social History   Tobacco Use   Smoking status: Never   Smokeless tobacco: Never  Substance Use Topics   Alcohol use: Yes    Comment: social   Drug use: No    Family History  Problem Relation Age of Onset   Mental illness Mother    Hypertension Father    Diabetes Father    Kidney disease Maternal Grandmother    Hyperlipidemia Maternal Grandfather    Hypertension Paternal Grandmother    Diabetes Paternal Grandmother    Stroke Paternal Grandfather    Hypertension Paternal Grandfather    Diabetes Paternal Grandfather     No Known Allergies  Medication list has been reviewed and updated.  Current Outpatient Medications on File Prior to Visit  Medication Sig Dispense Refill   phentermine 37.5 MG capsule Take 37.5 mg by mouth every morning.     SUMAtriptan (  IMITREX) 50 MG tablet TAKE 1 TABLET BY MOUTH EVERY 2 HOURS AS NEEDED FOR MIGRAINE. MAY REPEAT 1 TABLET IN 2 HOURS IF HEADACHE PERSISTS. MAX OF 2 TABLETS IN 24 HOURS. 10 tablet 5   No current facility-administered medications on file prior to visit.    Review of Systems:  As per HPI- otherwise negative.   Physical Examination: Vitals:   11/26/20 1114  BP: 120/82  Pulse: 90  Resp: 16  Temp: (!) 97.5 F (36.4 C)  SpO2: 99%   Vitals:   11/26/20 1114  Weight: 166 lb (75.3 kg)  Height: 5' 4"  (1.626 m)   Body mass index is 28.49 kg/m. Ideal Body Weight: Weight in (lb) to have BMI = 25: 145.3  GEN: no acute distress.  Overweight, looks well HEENT: Atraumatic,  Normocephalic.  Bilateral TM wnl, oropharynx normal.  PEERL,EOMI.   Ears and Nose: No external deformity. CV: RRR, No M/G/R. No JVD. No thrill. No extra heart sounds. PULM: CTA B, no wheezes, crackles, rhonchi. No retractions. No resp. distress. No accessory muscle use. ABD: S, NT, ND, +BS. No rebound. No HSM. EXTR: No c/c/e PSYCH: Normally interactive. Conversant.  Pelvic: normal, no vaginal lesions or discharge. Uterus normal, no CMT, no adnexal tendereness or masses   Assessment and Plan: Physical exam  Screening for diabetes mellitus - Plan: Comprehensive metabolic panel, Hemoglobin A1c  Screening for hyperlipidemia - Plan: Lipid panel  Screening for thyroid disorder - Plan: TSH  Screening for deficiency anemia - Plan: CBC  Encounter for hepatitis C screening test for low risk patient - Plan: Hepatitis C antibody  Fatigue, unspecified type - Plan: TSH, VITAMIN D 25 Hydroxy (Vit-D Deficiency, Fractures)  Screening for cervical cancer - Plan: Cytology - PAP  Screening mammogram for breast cancer - Plan: MM 3D SCREEN BREAST BILATERAL  Menstrual migraine without status migrainosus, not intractable - Prevention, symptomatic care, and red flags reviewed.  Seen today for a physical exam.  Encouraged healthy diet and exercise routine, went over screening tests as indicated  Pap collected  Refill Imitrex.  Patient notes that her current prescription is not lasting her a month, I increased her Imitrex strength from 50 to 100 mg so will last longer  We discussed her significant stressors and concern about ADHD.  I did encourage her to be evaluated by psychologist, if she does test positive for ADD I can start treatment for same.  Counseling may also be helpful for stress.  Right now she does not wish to go on any antidepressant medications  Will plan further follow- up pending labs.  This visit occurred during the SARS-CoV-2 public health emergency.  Safety protocols were in place,  including screening questions prior to the visit, additional usage of staff PPE, and extensive cleaning of exam room while observing appropriate contact time as indicated for disinfecting solutions.   Signed Lamar Blinks, MD  Received her labs as below, message to patient  Results for orders placed or performed in visit on 11/26/20  CBC  Result Value Ref Range   WBC 4.4 4.0 - 10.5 K/uL   RBC 5.22 (H) 3.87 - 5.11 Mil/uL   Platelets 240.0 150.0 - 400.0 K/uL   Hemoglobin 13.9 12.0 - 15.0 g/dL   HCT 43.2 36.0 - 46.0 %   MCV 82.7 78.0 - 100.0 fl   MCHC 32.3 30.0 - 36.0 g/dL   RDW 14.4 11.5 - 15.5 %  Comprehensive metabolic panel  Result Value Ref Range   Sodium 138 135 -  145 mEq/L   Potassium 3.9 3.5 - 5.1 mEq/L   Chloride 104 96 - 112 mEq/L   CO2 25 19 - 32 mEq/L   Glucose, Bld 80 70 - 99 mg/dL   BUN 11 6 - 23 mg/dL   Creatinine, Ser 0.77 0.40 - 1.20 mg/dL   Total Bilirubin 0.5 0.2 - 1.2 mg/dL   Alkaline Phosphatase 59 39 - 117 U/L   AST 16 0 - 37 U/L   ALT 14 0 - 35 U/L   Total Protein 7.3 6.0 - 8.3 g/dL   Albumin 4.3 3.5 - 5.2 g/dL   GFR 90.67 >60.00 mL/min   Calcium 8.9 8.4 - 10.5 mg/dL  Hemoglobin A1c  Result Value Ref Range   Hgb A1c MFr Bld 5.7 4.6 - 6.5 %  Lipid panel  Result Value Ref Range   Cholesterol 183 0 - 200 mg/dL   Triglycerides 75.0 0.0 - 149.0 mg/dL   HDL 64.70 >39.00 mg/dL   VLDL 15.0 0.0 - 40.0 mg/dL   LDL Cholesterol 104 (H) 0 - 99 mg/dL   Total CHOL/HDL Ratio 3    NonHDL 118.68   TSH  Result Value Ref Range   TSH 2.30 0.35 - 5.50 uIU/mL  VITAMIN D 25 Hydroxy (Vit-D Deficiency, Fractures)  Result Value Ref Range   VITD 33.77 30.00 - 100.00 ng/mL

## 2020-11-25 NOTE — Patient Instructions (Addendum)
It was great to see you again today, I will be in touch with your labs as soon as possible  If not done already please consider getting a covid 19 booster It sound as though you may have ADD.  This may be why the phentermine- which is a stimulant- is helping with your everyday functioning.  I would recommend that you consult with a licensed phycologist who can evaluate you for ADD and perhaps also discuss stressors in your life.  If you do have ADD I can treat you for it!   I ordered your mammogram which can be updated any time

## 2020-11-26 ENCOUNTER — Other Ambulatory Visit: Payer: Self-pay

## 2020-11-26 ENCOUNTER — Encounter: Payer: Self-pay | Admitting: Family Medicine

## 2020-11-26 ENCOUNTER — Other Ambulatory Visit (HOSPITAL_COMMUNITY)
Admission: RE | Admit: 2020-11-26 | Discharge: 2020-11-26 | Disposition: A | Discharge status: Home / Self Care | Payer: BC Managed Care – PPO | Source: Ambulatory Visit | Attending: Family Medicine | Admitting: Family Medicine

## 2020-11-26 ENCOUNTER — Ambulatory Visit (INDEPENDENT_AMBULATORY_CARE_PROVIDER_SITE_OTHER): Payer: BC Managed Care – PPO | Admitting: Family Medicine

## 2020-11-26 VITALS — BP 120/82 | HR 90 | Temp 97.5°F | Resp 16 | Ht 64.0 in | Wt 166.0 lb

## 2020-11-26 DIAGNOSIS — Z124 Encounter for screening for malignant neoplasm of cervix: Secondary | ICD-10-CM | POA: Diagnosis not present

## 2020-11-26 DIAGNOSIS — Z13220 Encounter for screening for lipoid disorders: Secondary | ICD-10-CM | POA: Diagnosis not present

## 2020-11-26 DIAGNOSIS — Z0000 Encounter for general adult medical examination without abnormal findings: Secondary | ICD-10-CM | POA: Diagnosis not present

## 2020-11-26 DIAGNOSIS — Z131 Encounter for screening for diabetes mellitus: Secondary | ICD-10-CM

## 2020-11-26 DIAGNOSIS — Z1329 Encounter for screening for other suspected endocrine disorder: Secondary | ICD-10-CM | POA: Diagnosis not present

## 2020-11-26 DIAGNOSIS — Z1159 Encounter for screening for other viral diseases: Secondary | ICD-10-CM

## 2020-11-26 DIAGNOSIS — Z130 Encounter for screening for diseases of the blood and blood-forming organs and certain disorders involving the immune mechanism: Secondary | ICD-10-CM

## 2020-11-26 DIAGNOSIS — R5383 Other fatigue: Secondary | ICD-10-CM

## 2020-11-26 DIAGNOSIS — Z1231 Encounter for screening mammogram for malignant neoplasm of breast: Secondary | ICD-10-CM

## 2020-11-26 DIAGNOSIS — R7303 Prediabetes: Secondary | ICD-10-CM | POA: Insufficient documentation

## 2020-11-26 DIAGNOSIS — G43829 Menstrual migraine, not intractable, without status migrainosus: Secondary | ICD-10-CM

## 2020-11-26 LAB — LIPID PANEL
Cholesterol: 183 mg/dL (ref 0–200)
HDL: 64.7 mg/dL (ref >39.00)
LDL Cholesterol: 104 mg/dL — ABNORMAL HIGH (ref 0–99)
NonHDL: 118.68
Total CHOL/HDL Ratio: 3
Triglycerides: 75 mg/dL (ref 0.0–149.0)
VLDL: 15 mg/dL (ref 0.0–40.0)

## 2020-11-26 LAB — CBC
HCT: 43.2 % (ref 36.0–46.0)
Hemoglobin: 13.9 g/dL (ref 12.0–15.0)
MCHC: 32.3 g/dL (ref 30.0–36.0)
MCV: 82.7 fl (ref 78.0–100.0)
Platelets: 240 10*3/uL (ref 150.0–400.0)
RBC: 5.22 Mil/uL — ABNORMAL HIGH (ref 3.87–5.11)
RDW: 14.4 % (ref 11.5–15.5)
WBC: 4.4 10*3/uL (ref 4.0–10.5)

## 2020-11-26 LAB — COMPREHENSIVE METABOLIC PANEL
ALT: 14 U/L (ref 0–35)
AST: 16 U/L (ref 0–37)
Albumin: 4.3 g/dL (ref 3.5–5.2)
Alkaline Phosphatase: 59 U/L (ref 39–117)
BUN: 11 mg/dL (ref 6–23)
CO2: 25 mEq/L (ref 19–32)
Calcium: 8.9 mg/dL (ref 8.4–10.5)
Chloride: 104 mEq/L (ref 96–112)
Creatinine, Ser: 0.77 mg/dL (ref 0.40–1.20)
GFR: 90.67 mL/min (ref >60.00)
Glucose, Bld: 80 mg/dL (ref 70–99)
Potassium: 3.9 mEq/L (ref 3.5–5.1)
Sodium: 138 mEq/L (ref 135–145)
Total Bilirubin: 0.5 mg/dL (ref 0.2–1.2)
Total Protein: 7.3 g/dL (ref 6.0–8.3)

## 2020-11-26 LAB — HEMOGLOBIN A1C: Hgb A1c MFr Bld: 5.7 % (ref 4.6–6.5)

## 2020-11-26 LAB — TSH: TSH: 2.3 u[IU]/mL (ref 0.35–5.50)

## 2020-11-26 LAB — VITAMIN D 25 HYDROXY (VIT D DEFICIENCY, FRACTURES): VITD: 33.77 ng/mL (ref 30.00–100.00)

## 2020-11-26 MED ORDER — SUMATRIPTAN SUCCINATE 100 MG PO TABS: ORAL_TABLET | ORAL | 6 refills | Status: DC

## 2020-11-27 ENCOUNTER — Encounter: Payer: Self-pay | Admitting: Family Medicine

## 2020-11-27 LAB — HEPATITIS C ANTIBODY
Hepatitis C Ab: NONREACTIVE
SIGNAL TO CUT-OFF: 0.17 (ref <1.00)

## 2020-11-27 LAB — CYTOLOGY - PAP
Comment: NEGATIVE
Diagnosis: NEGATIVE
High risk HPV: NEGATIVE

## 2020-12-24 ENCOUNTER — Other Ambulatory Visit: Payer: Self-pay

## 2020-12-24 ENCOUNTER — Ambulatory Visit (HOSPITAL_BASED_OUTPATIENT_CLINIC_OR_DEPARTMENT_OTHER)
Admission: RE | Admit: 2020-12-24 | Discharge: 2020-12-24 | Disposition: A | Discharge status: Home / Self Care | Payer: BC Managed Care – PPO | Source: Ambulatory Visit | Attending: Family Medicine | Admitting: Family Medicine

## 2020-12-24 ENCOUNTER — Encounter (HOSPITAL_BASED_OUTPATIENT_CLINIC_OR_DEPARTMENT_OTHER): Payer: Self-pay

## 2020-12-24 DIAGNOSIS — Z1231 Encounter for screening mammogram for malignant neoplasm of breast: Secondary | ICD-10-CM | POA: Diagnosis not present

## 2021-02-14 DIAGNOSIS — F99 Mental disorder, not otherwise specified: Secondary | ICD-10-CM | POA: Diagnosis not present

## 2021-03-05 DIAGNOSIS — F4323 Adjustment disorder with mixed anxiety and depressed mood: Secondary | ICD-10-CM | POA: Diagnosis not present

## 2021-03-26 DIAGNOSIS — F4323 Adjustment disorder with mixed anxiety and depressed mood: Secondary | ICD-10-CM | POA: Diagnosis not present

## 2021-04-18 ENCOUNTER — Telehealth: Payer: Self-pay | Admitting: Family Medicine

## 2021-04-18 NOTE — Telephone Encounter (Signed)
Pt has appt with Jodi Mourning, NP on 04/19/2021.   [facility] - ClientTELEPHONE ADVICE RECORDAccessNurse Patient Burna ---Caller states she has kidney pain and may have some blood in her urine. Pain located to right lower back pain. Does not know if it was her period. Does not know when blood in urine started. Pain started today in the morning. No fever. Denies burning or dysuria. Does the patient have any new or worsening symptoms? ---Yes Will a triage be completed? ---Yes Related visit to physician within the last 2 weeks? ---No Does the PT have any chronic conditions? (i.e. diabetes, asthma, this includes High risk factors for pregnancy, etc.) ---No Is the patient pregnant or possibly pregnant? (Ask all females between the ages of 64-55) ---No Is this a behavioral health or substance abuse call? ---No Guidelines Guideline Title Affirmed Question Affirmed Notes Nurse Date/Time (Eastern Time) Urine - Blood In Side (flank) or back pain present Phoebe Perch, RN, Dagoberto 04/18/2021 2:16:10 PM Disp. Time Eilene Ghazi Time) Disposition Final User 04/18/2021 2:19:01 PM See PCP within 24 Hours Yes Cervantes, RN, Dagoberto PLEASE NOTE: All timestamps contained within this report are represented as [place] Standard Time. CONFIDENTIALTY NOTICE: This fax transmission is intended only for the addressee. It contains information that is legally privileged, confidential or otherwise protected from use or disclosure. If you are not the intended recipient, you are strictly prohibited from reviewing, disclosing, copying using or disseminating any of this information or taking any action in reliance on or regarding this information. If you have received this fax in error, please notify us immediately by telephone so that we can arrange for its return to [place]. Phone: [phone], Toll-Free: [phone], Fax: [phone] Page: 2 of 2 Call Id: [id number] [place]  Disagree/Comply Comply Caller Understands Yes PreDisposition Did not know what to do Care Advice Given Per Guideline SEE PCP WITHIN 24 HOURS: * IF OFFICE WILL BE OPEN: You need to be examined within the next 24 hours. Call your doctor (or NP/PA) when the office opens and make an appointment. * IF OFFICE WILL BE CLOSED: You need to be seen within the next 24 hours. A clinic or an urgent care center is often a good source of care if your doctor's office is closed or you can't get an appointment. * Use the lowest amount of medicine that makes your pain better. * ACETAMINOPHEN - REGULAR STRENGTH TYLENOL: Take 650 mg (two 325 mg pills) by mouth every 4 to 6 hours as needed. Each Regular Strength Tylenol pill has 325 mg of acetaminophen. The most you should take is 10 pills a day (3,250 mg total). Note: In [place], the maximum is 12 pills a day (3,900 mg total). CALL BACK IF: * Fever occurs * You become worse CARE ADVICE given per Urine, Blood In (Adult) guideline. Referrals REFERRED TO PCP OFFIC

## 2021-04-18 NOTE — Telephone Encounter (Signed)
Pt states she was having kidney pain with blood in her urine, she was transferred to triage since we have no openings today.

## 2021-04-19 ENCOUNTER — Encounter: Payer: Self-pay | Admitting: Family

## 2021-04-19 ENCOUNTER — Ambulatory Visit (INDEPENDENT_AMBULATORY_CARE_PROVIDER_SITE_OTHER): Payer: BC Managed Care – PPO | Admitting: Family

## 2021-04-19 VITALS — BP 130/80 | HR 70 | Temp 97.8°F | Ht 64.0 in | Wt 178.8 lb

## 2021-04-19 DIAGNOSIS — R109 Unspecified abdominal pain: Secondary | ICD-10-CM | POA: Diagnosis not present

## 2021-04-19 DIAGNOSIS — N951 Menopausal and female climacteric states: Secondary | ICD-10-CM

## 2021-04-19 LAB — POCT URINALYSIS DIP (MANUAL ENTRY)
Bilirubin, UA: NEGATIVE
Glucose, UA: NEGATIVE mg/dL
Ketones, POC UA: NEGATIVE mg/dL
Leukocytes, UA: NEGATIVE
Nitrite, UA: NEGATIVE
Protein Ur, POC: NEGATIVE mg/dL
Spec Grav, UA: <=1.005 — AB (ref 1.010–1.025)
Urobilinogen, UA: 0.2 E.U./dL
pH, UA: 6.5 (ref 5.0–8.0)

## 2021-04-19 LAB — FOLLICLE STIMULATING HORMONE: FSH: 14.1 m[IU]/mL

## 2021-04-19 LAB — LUTEINIZING HORMONE: LH: 12.34 m[IU]/mL

## 2021-04-19 MED ORDER — MELOXICAM 15 MG PO TABS: 15.0000 mg | ORAL_TABLET | Freq: Every day | ORAL | 0 refills | Status: DC

## 2021-04-19 MED ORDER — METHOCARBAMOL 500 MG PO TABS: 500.0000 mg | ORAL_TABLET | Freq: Three times a day (TID) | ORAL | 0 refills | Status: DC | PRN

## 2021-04-19 NOTE — Progress Notes (Signed)
Sara Reynolds is a 50 y.o. female with the following history as recorded in EpicCare:  Patient Active Problem List   Diagnosis Date Noted   Prediabetes 11/26/2020   Breast mass, right 05/14/2015   Soft tissue mass 11/03/2014   Depression 12/09/2012    Current Outpatient Medications  Medication Sig Dispense Refill   meloxicam (MOBIC) 15 MG tablet Take 1 tablet (15 mg total) by mouth daily. 30 tablet 0   methocarbamol (ROBAXIN) 500 MG tablet Take 1 tablet (500 mg total) by mouth every 8 (eight) hours as needed. 30 tablet 0   SUMAtriptan (IMITREX) 100 MG tablet Take 1/2 to 1 tablet as needed for migraine headache.  May repeat in 1 hour if needed.  Max 200 mg per 24 hours 10 tablet 6   No current facility-administered medications for this visit.    Allergies: Patient has no known allergies.  Past Medical History:  Diagnosis Date   Allergy    Chicken pox    Hay fever    Thyroid goiter     Past Surgical History:  Procedure Laterality Date   CESAREAN SECTION      Family History  Problem Relation Age of Onset   Mental illness Mother    Hypertension Father    Diabetes Father    Kidney disease Maternal Grandmother    Hyperlipidemia Maternal Grandfather    Hypertension Paternal Grandmother    Diabetes Paternal Grandmother    Stroke Paternal Grandfather    Hypertension Paternal Grandfather    Diabetes Paternal Grandfather     Social History   Tobacco Use   Smoking status: Never   Smokeless tobacco: Never  Substance Use Topics   Alcohol use: Yes    Comment: social    Subjective:  Right flank pain x 24 hours;  did have spotting yesterday as well but unable to determine if vaginal or urinary; periods are very light but notes that this timing would be consistent for period;  Pain in her back is more noticeable with movement; pain does respond to Tylenol; did have a fall prior to onset of symptoms; no burning on urination or frequency;   Also mentions increased symptoms for  menopause/ secondary anxiety; would like to discuss treatment options;      Objective:  Vitals:   04/19/21 1108  BP: 130/80  Pulse: 70  Temp: 97.8 F (36.6 C)  TempSrc: Oral  SpO2: 99%  Weight: 178 lb 12.8 oz (81.1 kg)  Height: 5' 4"  (1.626 m)    General: Well developed, well nourished, in no acute distress  Skin : Warm and dry.  Head: Normocephalic and atraumatic  Eyes: Sclera and conjunctiva clear; pupils round and reactive to light; extraocular movements intact  Ears: External normal; canals clear; tympanic membranes normal  Oropharynx: Pink, supple. No suspicious lesions  Neck: Supple without thyromegaly, adenopathy  Lungs: Respirations unlabored; clear to auscultation bilaterally without wheeze, rales, rhonchi  Musculoskeletal: No deformities; no active joint inflammation; area of concern is localized over right buttock Extremities: No edema, cyanosis, clubbing  Vessels: Symmetric bilaterally  Neurologic: Alert and oriented; speech intact; face symmetrical; moves all extremities well; CNII-XII intact without focal deficit   Assessment:  1. Right flank pain   2. Menopausal symptoms     Plan:  Suspect muscular; U/A is clear- does show blood but suspect vaginal source; Rx for Mobic and Meloxicam; rest, heating pad/ ice and follow up worse, no better.  Discussed non-hormonal options including SSRI or Effexor XR; update  [place], LH; she will reach out to her PCP to discuss treatment options;   This visit occurred during the SARS-CoV-2 public health emergency.  Safety protocols were in place, including screening questions prior to the visit, additional usage of staff PPE, and extensive cleaning of exam room while observing appropriate contact time as indicated for disinfecting solutions.    No follow-ups on file.  Orders Placed This Encounter  Procedures   Urine Culture   [facility]   LH   POCT urinalysis dipstick    Requested Prescriptions   Signed Prescriptions Disp Refills    meloxicam (MOBIC) 15 MG tablet 30 tablet 0    Sig: Take 1 tablet (15 mg total) by mouth daily.   methocarbamol (ROBAXIN) 500 MG tablet 30 tablet 0    Sig: Take 1 tablet (500 mg total) by mouth every 8 (eight) hours as needed.

## 2021-04-19 NOTE — Patient Instructions (Signed)
Sertraline, Celexa or Effexor XR- non hormonal options to treat menopuase    Menopause Menopause is the normal time of a woman's life when menstrual periods stop completely. It marks the natural end to a woman's ability to become pregnant. It can be defined as the absence of a menstrual period for 12 months without another medical cause. The transition to menopause (perimenopause) most often happens between the ages of 70 and 40, and can last for many years. During perimenopause, hormone levels change in your body, which can cause symptoms and affect your health. Menopause may increase your risk for: Weakened bones (osteoporosis), which causes fractures. Depression. Hardening and narrowing of the arteries (atherosclerosis), which can cause heart attacks and strokes. What are the causes? This condition is usually caused by a natural change in hormone levels that happens as you get older. The condition may also be caused by changes that are not natural, including: Surgery to remove both ovaries (surgical menopause). Side effects from some medicines, such as chemotherapy used to treat cancer (chemical menopause). What increases the risk? This condition is more likely to start at an earlier age if you have certain medical conditions or have undergone treatments, including: A tumor of the pituitary gland in the brain. A disease that affects the ovaries and hormones. Certain cancer treatments, such as chemotherapy or hormone therapy, or radiation therapy on the pelvis. Heavy smoking and excessive alcohol use. Family history of early menopause. This condition is also more likely to develop earlier in women who are very thin. What are the signs or symptoms? Symptoms of this condition include: Hot flashes. Irregular menstrual periods. Night sweats. Changes in feelings about sex. This could be a decrease in sex drive or an increased discomfort around your sexuality. Vaginal dryness and thinning of  the vaginal walls. This may cause painful sex. Dryness of the skin and development of wrinkles. Headaches. Problems sleeping (insomnia). Mood swings or irritability. Memory problems. Weight gain. Hair growth on the face and chest. Bladder infections or problems with urinating. How is this diagnosed? This condition is diagnosed based on your medical history, a physical exam, your age, your menstrual history, and your symptoms. Hormone tests may also be done. How is this treated? In some cases, no treatment is needed. You and your health care provider should make a decision together about whether treatment is necessary. Treatment will be based on your individual condition and preferences. Treatment for this condition focuses on managing symptoms. Treatment may include: Menopausal hormone therapy (MHT). Medicines to treat specific symptoms or complications. Acupuncture. Vitamin or herbal supplements. Before starting treatment, make sure to let your health care provider know if you have a personal or family history of these conditions: Heart disease. Breast cancer. Blood clots. Diabetes. Osteoporosis. Follow these instructions at home: Lifestyle Do not use any products that contain nicotine or tobacco, such as cigarettes, e-cigarettes, and chewing tobacco. If you need help quitting, ask your health care provider. Get at least 30 minutes of physical activity on 5 or more days each week. Avoid alcoholic and caffeinated beverages, as well as spicy foods. This may help prevent hot flashes. Get 7-8 hours of sleep each night. If you have hot flashes, try: Dressing in layers. Avoiding things that may trigger hot flashes, such as spicy food, warm places, or stress. Taking slow, deep breaths when a hot flash starts. Keeping a fan in your home and office. Find ways to manage stress, such as deep breathing, meditation, or journaling. Consider going to  group therapy with other women who are having  menopause symptoms. Ask your health care provider about recommended group therapy meetings. Eating and drinking  Eat a healthy, balanced diet that contains whole grains, lean protein, low-fat dairy, and plenty of fruits and vegetables. Your health care provider may recommend adding more soy to your diet. Foods that contain soy include tofu, tempeh, and soy milk. Eat plenty of foods that contain calcium and vitamin D for bone health. Items that are rich in calcium include low-fat milk, yogurt, beans, almonds, sardines, broccoli, and kale. Medicines Take over-the-counter and prescription medicines only as told by your health care provider. Talk with your health care provider before starting any herbal supplements. If prescribed, take vitamins and supplements as told by your health care provider. General instructions  Keep track of your menstrual periods, including: When they occur. How heavy they are and how long they last. How much time passes between periods. Keep track of your symptoms, noting when they start, how often you have them, and how long they last. Use vaginal lubricants or moisturizers to help with vaginal dryness and improve comfort during sex. Keep all follow-up visits. This is important. This includes any group therapy or counseling. Contact a health care provider if: You are still having menstrual periods after age 50. You have pain during sex. You have not had a period for 12 months and you develop vaginal bleeding. Get help right away if you have: Severe depression. Excessive vaginal bleeding. Pain when you urinate. A fast or irregular heartbeat (palpitations). Severe headaches. Abdominal pain or severe indigestion. Summary Menopause is a normal time of life when menstrual periods stop completely. It is usually defined as the absence of a menstrual period for 12 months without another medical cause. The transition to menopause (perimenopause) most often happens  between the ages of 86 and 74 and can last for several years. Symptoms can be managed through medicines, lifestyle changes, and complementary therapies such as acupuncture. Eat a balanced diet that is rich in nutrients to promote bone health and heart health and to manage symptoms during menopause. This information is not intended to replace advice given to you by your health care provider. Make sure you discuss any questions you have with your health care provider. Document Revised: 01/13/2020 Document Reviewed: 09/29/2019 Elsevier Patient Education  [place]. Menopause and Hormone Replacement Therapy Menopause is a normal time of life when menstrual periods stop completely and the ovaries stop producing the female hormones estrogen and progesterone. Low levels of these hormones can affect your health and cause symptoms. Hormone replacement therapy (HRT) can relieve some of those symptoms. HRT is the use of artificial (synthetic) hormones to replace hormones that your body has stopped producing because you have reached menopause. Types of HRT HRT may consist of the synthetic hormones estrogen and progestin, or it may consist of estrogen-only therapy. You and your health care provider will decide which form of HRT is best for you. If you choose to be on HRT and you have a uterus, estrogen and progestin are usually prescribed. Estrogen-only therapy is used for women who do not have a uterus. Possible options for taking HRT include: Pills. Patches. Gels. Sprays. Vaginal cream. Vaginal rings. Vaginal inserts. The amount of hormones that you take and how long you take them varies according to your health. It is important to: Begin HRT with the lowest possible dosage. Stop HRT as soon as your health care provider tells you to stop. Work  with your health care provider so that you feel informed and comfortable with your decisions. Tell a health care provider about: Any allergies you  have. Whether you have had blood clots or know of any risk factors you may have for blood clots. Whether you or family members have had cancer, especially cancer of the breasts, ovaries, or uterus. Any surgeries you have had. All medicines you are taking, including vitamins, herbs, eye drops, creams, and over-the-counter medicines. Whether you are pregnant or may be pregnant. Any medical conditions you have. What are the benefits? HRT can reduce the frequency and severity of menopausal symptoms. Benefits of HRT vary according to the kind of symptoms that you have, how severe they are, and your overall health. HRT may help to improve the following symptoms of menopause: Hot flashes and night sweats. These are sudden feelings of heat that spread over the face and body. The skin may turn red, like a blush. Night sweats are hot flashes that happen while you are sleeping or trying to sleep. Bone loss (osteoporosis). The body loses calcium more quickly after menopause, causing the bones to become weaker. This can increase the risk for bone breaks (fractures). Vaginal dryness. The lining of the vagina can become thin and dry, which can cause pain during sex or cause infection, burning, or itching. Urinary tract infections. Urinary incontinence. This is the inability to control when you urinate. Irritability. Short-term memory problems. What are the risks? Risks of HRT vary depending on your individual health and medical history. Risks of HRT also depend on whether you receive both estrogen and progestin or you receive estrogen only. HRT may increase the risk of: Spotting. This is when a small amount of blood leaks from the vagina unexpectedly. Endometrial cancer. This cancer is in the lining of the uterus (endometrium). Breast cancer. Increased density of breast tissue. This can make it harder to find breast cancer on a breast X-ray (mammogram). Stroke. Heart disease. Blood clots. Gallbladder  disease or liver disease. Risks of HRT can increase if you have any of the following conditions: Endometrial cancer. Liver disease. Heart disease. Breast cancer. History of blood clots. History of stroke. Follow these instructions at home: Pap tests Have Pap tests done as often as told by your health care provider. A Pap test is sometimes called a Pap smear. It is a screening test that is used to check for signs of cancer of the cervix and vagina. A Pap test can also identify the presence of infection or precancerous changes. Pap tests may be done: Every 3 years, starting at age 26. Every 5 years, starting after age 63, in combination with testing for human papillomavirus (HPV). More often or less often depending on other medical conditions you have, your age, and other risk factors. It is up to you to get the results of your Pap test. Ask your health care provider, or the department that is doing the test, when your results will be ready. General instructions Take over-the-counter and prescription medicines only as told by your health care provider. Do not use any products that contain nicotine or tobacco. These products include cigarettes, chewing tobacco, and vaping devices, such as e-cigarettes. If you need help quitting, ask your health care provider. Get mammograms, pelvic exams, and medical checkups as often as told by your health care provider. Keep all follow-up visits. This is important. Contact a health care provider if you have: Pain or swelling in your legs. Lumps or changes in your breasts  or armpits. Pain, burning, or bleeding when you urinate. Unusual vaginal bleeding. Dizziness or headaches. Pain in your abdomen. Get help right away if you have: Shortness of breath. Chest pain. Slurred speech. Weakness or numbness in any part of your arms or legs. These symptoms may represent a serious problem that is an emergency. Do not wait to see if the symptoms will go away. Get  medical help right away. Call your local emergency services (911 in the [place] Do not drive yourself to the hospital. Summary Menopause is a normal time of life when menstrual periods stop completely and the ovaries stop producing the female hormones estrogen and progesterone. HRT can reduce the frequency and severity of menopausal symptoms. Risks of HRT vary depending on your individual health and medical history. This information is not intended to replace advice given to you by your health care provider. Make sure you discuss any questions you have with your health care provider. Document Revised: 10/17/2019 Document Reviewed: 10/17/2019 Elsevier Patient Education  [place].

## 2021-04-21 LAB — URINE CULTURE
MICRO NUMBER:: 12795456
SPECIMEN QUALITY:: ADEQUATE

## 2021-04-23 ENCOUNTER — Other Ambulatory Visit: Payer: Self-pay | Admitting: Family

## 2021-04-23 MED ORDER — PENICILLIN V POTASSIUM 500 MG PO TABS: 500.0000 mg | ORAL_TABLET | Freq: Three times a day (TID) | ORAL | 0 refills | Status: AC

## 2021-05-16 ENCOUNTER — Other Ambulatory Visit: Payer: Self-pay | Admitting: Family

## 2021-07-02 ENCOUNTER — Other Ambulatory Visit: Payer: Self-pay | Admitting: Family Medicine

## 2021-07-02 DIAGNOSIS — G43829 Menstrual migraine, not intractable, without status migrainosus: Secondary | ICD-10-CM

## 2021-07-23 ENCOUNTER — Other Ambulatory Visit: Payer: Self-pay | Admitting: Family Medicine

## 2021-07-23 DIAGNOSIS — G43829 Menstrual migraine, not intractable, without status migrainosus: Secondary | ICD-10-CM

## 2021-07-24 ENCOUNTER — Other Ambulatory Visit: Payer: Self-pay | Admitting: Family Medicine

## 2021-07-24 ENCOUNTER — Other Ambulatory Visit: Payer: Self-pay

## 2021-07-24 ENCOUNTER — Telehealth: Payer: Self-pay | Admitting: Family Medicine

## 2021-07-24 DIAGNOSIS — G43829 Menstrual migraine, not intractable, without status migrainosus: Secondary | ICD-10-CM

## 2021-07-24 MED ORDER — SUMATRIPTAN SUCCINATE 100 MG PO TABS: ORAL_TABLET | ORAL | 6 refills | Status: DC

## 2021-07-24 NOTE — Telephone Encounter (Signed)
Medication:  ?SUMAtriptan (IMITREX) 100 MG tablet [[id number]]  ?  ? ?Has the patient contacted their pharmacy? No. ?(If no, request that the patient contact the pharmacy for the refill.) ?(If yes, when and what did the pharmacy advise?) ? ?  ? ?Preferred Pharmacy (with phone number or street name):  ?[id number] [place] [place] - [address]  ?[address], [place] [address]  ?Phone:  [phone]  Fax:  [phone]  ?  ? ?Agent: Please be advised that RX refills may take up to 3 business days. We ask that you follow-up with your pharmacy. ? ? ?Pt states she's having bad headaches and is currently out of meds  ?

## 2021-07-24 NOTE — Telephone Encounter (Signed)
Refill sent.

## 2022-06-12 ENCOUNTER — Telehealth: Payer: Self-pay | Admitting: Family Medicine

## 2022-06-12 NOTE — Telephone Encounter (Signed)
Patient called to request a Transfer of Care  From:  J. Copland, MD at [facility]  To:   Judieth Keens, MD at [facility] both providers alright with this request?  Please advise.  Respectfully,  IC

## 2022-06-16 NOTE — Telephone Encounter (Signed)
Pt's TOC with Dr. Jerilee Hoh scheduled for 07/17/2022

## 2022-07-17 ENCOUNTER — Encounter: Payer: Self-pay | Admitting: Internal Medicine

## 2022-07-17 ENCOUNTER — Ambulatory Visit (INDEPENDENT_AMBULATORY_CARE_PROVIDER_SITE_OTHER): Payer: BC Managed Care – PPO | Admitting: Internal Medicine

## 2022-07-17 VITALS — BP 139/99 | HR 91 | Temp 98.3°F | Ht 64.0 in | Wt 195.4 lb

## 2022-07-17 DIAGNOSIS — G43829 Menstrual migraine, not intractable, without status migrainosus: Secondary | ICD-10-CM

## 2022-07-17 DIAGNOSIS — F331 Major depressive disorder, recurrent, moderate: Secondary | ICD-10-CM | POA: Diagnosis not present

## 2022-07-17 MED ORDER — ESCITALOPRAM OXALATE 10 MG PO TABS: 10.0000 mg | ORAL_TABLET | Freq: Every day | ORAL | 1 refills | Status: DC

## 2022-07-17 MED ORDER — SUMATRIPTAN SUCCINATE 100 MG PO TABS: ORAL_TABLET | ORAL | 6 refills | Status: DC

## 2022-07-17 NOTE — Progress Notes (Signed)
Established Patient Office Visit     CC/Reason for Visit: Discuss depression, establish care  HPI: Sara Reynolds is a 52 y.o. female who is coming in today for the above mentioned reasons. Past Medical History is significant for: Depression, prediabetes, obesity.  She feels very depressed.  She has been overeating as a result and has gained weight.  She feels tired all the time, lacks motivation to get up and go.  Feels bad about how this affects her relationships with her children.   Past Medical/Surgical History: Past Medical History:  Diagnosis Date   Allergy    Chicken pox    Hay fever    Thyroid goiter     Past Surgical History:  Procedure Laterality Date   CESAREAN SECTION      Social History:  reports that she has never smoked. She has never used smokeless tobacco. She reports that she does not currently use alcohol. She reports that she does not use drugs.  Allergies: No Known Allergies  Family History:  Family History  Problem Relation Age of Onset   Mental illness Mother    Hypertension Father    Diabetes Father    Kidney disease Maternal Grandmother    Hyperlipidemia Maternal Grandfather    Hypertension Paternal Grandmother    Diabetes Paternal Grandmother    Stroke Paternal Grandfather    Hypertension Paternal Grandfather    Diabetes Paternal Grandfather      Current Outpatient Medications:    escitalopram (LEXAPRO) 10 MG tablet, Take 1 tablet (10 mg total) by mouth daily., Disp: 90 tablet, Rfl: 1   SUMAtriptan (IMITREX) 100 MG tablet, Take 1/2 to 1 tablet as needed for migraine headache.  May repeat in 1 hour if needed.  Max 200 mg per 24 hours, Disp: 10 tablet, Rfl: 6  Review of Systems:  Negative unless indicated in HPI.   Physical Exam: Vitals:   07/17/22 1258 07/17/22 1306  BP: (!) 140/100 (!) 139/99  Pulse: 91   Temp: 98.3 F (36.8 C)   TempSrc: Oral   SpO2: 98%   Weight: 195 lb 6.4 oz (88.6 kg)   Height: 5\' 4"  (1.626 m)      Body mass index is 33.54 kg/m.   Physical Exam Vitals reviewed.  Constitutional:      Appearance: Normal appearance.  HENT:     Head: Normocephalic and atraumatic.  Eyes:     Conjunctiva/sclera: Conjunctivae normal.     Pupils: Pupils are equal, round, and reactive to light.  Cardiovascular:     Rate and Rhythm: Normal rate and regular rhythm.  Pulmonary:     Effort: Pulmonary effort is normal.     Breath sounds: Normal breath sounds.  Skin:    General: Skin is warm and dry.  Neurological:     General: No focal deficit present.     Mental Status: She is alert and oriented to person, place, and time.  Psychiatric:        Mood and Affect: Mood normal.        Behavior: Behavior normal.        Thought Content: Thought content normal.        Judgment: Judgment normal.      Impression and Plan:  Moderate episode of recurrent major depressive disorder ([place]) - Plan: escitalopram (LEXAPRO) 10 MG tablet  Menstrual migraine without status migrainosus, not intractable - Prevention, symptomatic care, and red flags reviewed. - Plan: SUMAtriptan (IMITREX) 100 MG tablet  Flowsheet  Row Office Visit from 07/17/2022 in [place] at [facility] Total Score 23      -She is severely depressed.  Start Lexapro 10 mg daily.  Have advised to touch base with a behavioral health counselor for CBT.  Brochures have been provided. -She will schedule follow-up in 3 months for annual physical.   Time spent:32 minutes reviewing chart, interviewing and examining patient and formulating plan of care.     Lelon Frohlich, MD [facility] at [facility]

## 2022-10-16 ENCOUNTER — Encounter: Payer: Self-pay | Admitting: Internal Medicine

## 2022-10-16 ENCOUNTER — Ambulatory Visit (INDEPENDENT_AMBULATORY_CARE_PROVIDER_SITE_OTHER): Payer: 59 | Admitting: Internal Medicine

## 2022-10-16 VITALS — BP 120/88 | HR 75 | Temp 98.1°F | Ht 64.0 in | Wt 189.2 lb

## 2022-10-16 DIAGNOSIS — Z1231 Encounter for screening mammogram for malignant neoplasm of breast: Secondary | ICD-10-CM

## 2022-10-16 DIAGNOSIS — Z1211 Encounter for screening for malignant neoplasm of colon: Secondary | ICD-10-CM

## 2022-10-16 DIAGNOSIS — Z0000 Encounter for general adult medical examination without abnormal findings: Secondary | ICD-10-CM | POA: Diagnosis not present

## 2022-10-16 DIAGNOSIS — R7303 Prediabetes: Secondary | ICD-10-CM

## 2022-10-16 DIAGNOSIS — Z23 Encounter for immunization: Secondary | ICD-10-CM

## 2022-10-16 DIAGNOSIS — Z1159 Encounter for screening for other viral diseases: Secondary | ICD-10-CM

## 2022-10-16 DIAGNOSIS — H539 Unspecified visual disturbance: Secondary | ICD-10-CM

## 2022-10-16 DIAGNOSIS — G43829 Menstrual migraine, not intractable, without status migrainosus: Secondary | ICD-10-CM | POA: Diagnosis not present

## 2022-10-16 DIAGNOSIS — F331 Major depressive disorder, recurrent, moderate: Secondary | ICD-10-CM | POA: Diagnosis not present

## 2022-10-16 DIAGNOSIS — Z13220 Encounter for screening for lipoid disorders: Secondary | ICD-10-CM

## 2022-10-16 LAB — COMPREHENSIVE METABOLIC PANEL
ALT: 19 U/L (ref 0–35)
AST: 21 U/L (ref 0–37)
Albumin: 4.4 g/dL (ref 3.5–5.2)
Alkaline Phosphatase: 65 U/L (ref 39–117)
BUN: 14 mg/dL (ref 6–23)
CO2: 25 mEq/L (ref 19–32)
Calcium: 9.1 mg/dL (ref 8.4–10.5)
Chloride: 104 mEq/L (ref 96–112)
Creatinine, Ser: 0.85 mg/dL (ref 0.40–1.20)
GFR: 79.47 mL/min (ref >60.00)
Glucose, Bld: 82 mg/dL (ref 70–99)
Potassium: 3.5 mEq/L (ref 3.5–5.1)
Sodium: 138 mEq/L (ref 135–145)
Total Bilirubin: 0.4 mg/dL (ref 0.2–1.2)
Total Protein: 7.8 g/dL (ref 6.0–8.3)

## 2022-10-16 LAB — HEMOGLOBIN A1C: Hgb A1c MFr Bld: 5.6 % (ref 4.6–6.5)

## 2022-10-16 LAB — CBC WITH DIFFERENTIAL/PLATELET
Basophils Absolute: 0 10*3/uL (ref 0.0–0.1)
Basophils Relative: 0.5 % (ref 0.0–3.0)
Eosinophils Absolute: 0 10*3/uL (ref 0.0–0.7)
Eosinophils Relative: 0.3 % (ref 0.0–5.0)
HCT: 43.3 % (ref 36.0–46.0)
Hemoglobin: 14.1 g/dL (ref 12.0–15.0)
Lymphocytes Relative: 22.5 % (ref 12.0–46.0)
Lymphs Abs: 1.5 10*3/uL (ref 0.7–4.0)
MCHC: 32.6 g/dL (ref 30.0–36.0)
MCV: 83.4 fl (ref 78.0–100.0)
Monocytes Absolute: 0.4 10*3/uL (ref 0.1–1.0)
Monocytes Relative: 6.8 % (ref 3.0–12.0)
Neutro Abs: 4.5 10*3/uL (ref 1.4–7.7)
Neutrophils Relative %: 69.9 % (ref 43.0–77.0)
Platelets: 242 10*3/uL (ref 150.0–400.0)
RBC: 5.19 Mil/uL — ABNORMAL HIGH (ref 3.87–5.11)
RDW: 15.5 % (ref 11.5–15.5)
WBC: 6.5 10*3/uL (ref 4.0–10.5)

## 2022-10-16 LAB — VITAMIN B12: Vitamin B-12: 811 pg/mL (ref 211–911)

## 2022-10-16 LAB — LIPID PANEL
Cholesterol: 190 mg/dL (ref 0–200)
HDL: 53.9 mg/dL (ref >39.00)
LDL Cholesterol: 108 mg/dL — ABNORMAL HIGH (ref 0–99)
NonHDL: 136.48
Total CHOL/HDL Ratio: 4
Triglycerides: 142 mg/dL (ref 0.0–149.0)
VLDL: 28.4 mg/dL (ref 0.0–40.0)

## 2022-10-16 LAB — VITAMIN D 25 HYDROXY (VIT D DEFICIENCY, FRACTURES): VITD: 30.89 ng/mL (ref 30.00–100.00)

## 2022-10-16 LAB — TSH: TSH: 1.54 u[IU]/mL (ref 0.35–5.50)

## 2022-10-16 MED ORDER — ESCITALOPRAM OXALATE 10 MG PO TABS: 10.0000 mg | ORAL_TABLET | Freq: Every day | ORAL | 1 refills | Status: DC

## 2022-10-16 MED ORDER — SUMATRIPTAN SUCCINATE 100 MG PO TABS: ORAL_TABLET | ORAL | 6 refills | Status: DC

## 2022-10-16 NOTE — Progress Notes (Signed)
Established Patient Office Visit     CC/Reason for Visit: Annual preventive exam  HPI: Sara Reynolds is a 52 y.o. female who is coming in today for the above mentioned reasons. Past Medical History is significant for: Impaired glucose tolerance, obesity, depression.  Feels much improved after starting Lexapro 10 mg.  She is overdue for eye and dental care.  She is overdue for shingles vaccine.  She is overdue for breast and colon cancer screening.  She had a normal Pap smear in 2022.   Past Medical/Surgical History: Past Medical History:  Diagnosis Date   Allergy    Chicken pox    Hay fever    Thyroid goiter     Past Surgical History:  Procedure Laterality Date   CESAREAN SECTION      Social History:  reports that she has never smoked. She has never used smokeless tobacco. She reports that she does not currently use alcohol. She reports that she does not use drugs.  Allergies: No Known Allergies  Family History:  Family History  Problem Relation Age of Onset   Mental illness Mother    Hypertension Father    Diabetes Father    Kidney disease Maternal Grandmother    Hyperlipidemia Maternal Grandfather    Hypertension Paternal Grandmother    Diabetes Paternal Grandmother    Stroke Paternal Grandfather    Hypertension Paternal Grandfather    Diabetes Paternal Grandfather      Current Outpatient Medications:    escitalopram (LEXAPRO) 10 MG tablet, Take 1 tablet (10 mg total) by mouth daily., Disp: 90 tablet, Rfl: 1   SUMAtriptan (IMITREX) 100 MG tablet, Take 1/2 to 1 tablet as needed for migraine headache.  May repeat in 1 hour if needed.  Max 200 mg per 24 hours, Disp: 10 tablet, Rfl: 6  Review of Systems:  Negative unless indicated in HPI.   Physical Exam: Vitals:   10/16/22 0907  BP: 120/88  Pulse: 75  Temp: 98.1 F (36.7 C)  TempSrc: Oral  SpO2: 99%  Weight: 189 lb 3.2 oz (85.8 kg)  Height: 5\' 4"  (1.626 m)    Body mass index is 32.48  kg/m.   Physical Exam Vitals reviewed.  Constitutional:      General: She is not in acute distress.    Appearance: Normal appearance. She is not ill-appearing, toxic-appearing or diaphoretic.  HENT:     Head: Normocephalic.     Right Ear: Tympanic membrane, ear canal and external ear normal. There is no impacted cerumen.     Left Ear: Tympanic membrane, ear canal and external ear normal. There is no impacted cerumen.     Nose: Nose normal.     Mouth/Throat:     Mouth: Mucous membranes are moist.     Pharynx: Oropharynx is clear. No oropharyngeal exudate or posterior oropharyngeal erythema.  Eyes:     General: No scleral icterus.       Right eye: No discharge.        Left eye: No discharge.     Conjunctiva/sclera: Conjunctivae normal.     Pupils: Pupils are equal, round, and reactive to light.  Neck:     Vascular: No carotid bruit.  Cardiovascular:     Rate and Rhythm: Normal rate and regular rhythm.     Pulses: Normal pulses.     Heart sounds: Normal heart sounds.  Pulmonary:     Effort: Pulmonary effort is normal. No respiratory distress.  Breath sounds: Normal breath sounds.  Abdominal:     General: Abdomen is flat. Bowel sounds are normal.     Palpations: Abdomen is soft.  Musculoskeletal:        General: Normal range of motion.     Cervical back: Normal range of motion.  Skin:    General: Skin is warm and dry.  Neurological:     General: No focal deficit present.     Mental Status: She is alert and oriented to person, place, and time. Mental status is at baseline.  Psychiatric:        Mood and Affect: Mood normal.        Behavior: Behavior normal.        Thought Content: Thought content normal.        Judgment: Judgment normal.     Flowsheet Row Office Visit from 10/16/2022 in [facility] [place]  PHQ-9 Total Score 5        Impression and Plan:  Encounter for preventive health examination  Prediabetes -     CBC with  Differential/Platelet; Future -     Comprehensive metabolic panel; Future -     Lipid panel; Future -     TSH; Future -     Vitamin B12; Future -     VITAMIN D 25 Hydroxy (Vit-D Deficiency, Fractures); Future -     Hemoglobin A1c; Future -     Ambulatory referral to Ophthalmology  Moderate episode of recurrent major depressive disorder (HCC) -     Escitalopram Oxalate; Take 1 tablet (10 mg total) by mouth daily.  Dispense: 90 tablet; Refill: 1  Menstrual migraine without status migrainosus, not intractable -     SUMAtriptan Succinate; Take 1/2 to 1 tablet as needed for migraine headache.  May repeat in 1 hour if needed.  Max 200 mg per 24 hours  Dispense: 10 tablet; Refill: 6  Encounter for screening mammogram for malignant neoplasm of breast -     Digital Screening Mammogram, Left and Right; Future  Vision changes -     Ambulatory referral to Ophthalmology  Screening for malignant neoplasm of colon -     Ambulatory referral to Gastroenterology  Immunization due  Encounter for hepatitis C screening test for low risk patient -     Hepatitis C antibody; Future   -Recommend routine eye and dental care. -Healthy lifestyle discussed in detail. -Labs to be updated today. -Prostate cancer screening: N/A Health Maintenance  Topic Date Due   COVID-19 Vaccine (3 - 2023-24 season) 12/27/2021   Zoster (Shingles) Vaccine (1 of 2) 10/17/2022*   Flu Shot  11/27/2022   Mammogram  12/25/2022   Cologuard (Stool DNA test)  03/14/2023   Pap Smear  11/27/2023   DTaP/Tdap/Td vaccine (2 - Td or Tdap) 08/25/2027   Hepatitis C Screening  Completed   HIV Screening  Completed   HPV Vaccine  Aged Out  *Topic was postponed. The date shown is not the original due date.   -First shingles vaccine today. -Orders placed for mammogram and colonoscopy as well as eye exam.     Chaya Jan, MD [facility] at [facility]

## 2022-10-17 LAB — HEPATITIS C ANTIBODY: Hepatitis C Ab: NONREACTIVE

## 2022-12-10 ENCOUNTER — Encounter: Payer: Self-pay | Admitting: Adult Health

## 2022-12-10 ENCOUNTER — Ambulatory Visit: Payer: Medicaid Other | Admitting: Adult Health

## 2022-12-10 VITALS — BP 110/80 | HR 76 | Temp 99.1°F | Ht 64.0 in | Wt 192.0 lb

## 2022-12-10 DIAGNOSIS — R52 Pain, unspecified: Secondary | ICD-10-CM

## 2022-12-10 DIAGNOSIS — B349 Viral infection, unspecified: Secondary | ICD-10-CM | POA: Diagnosis not present

## 2022-12-10 DIAGNOSIS — R509 Fever, unspecified: Secondary | ICD-10-CM

## 2022-12-10 LAB — POCT INFLUENZA A/B
Influenza A, POC: NEGATIVE
Influenza B, POC: NEGATIVE

## 2022-12-10 LAB — POC COVID19 BINAXNOW: SARS Coronavirus 2 Ag: NEGATIVE

## 2022-12-10 NOTE — Progress Notes (Signed)
Subjective:    Patient ID: Sara Reynolds, female    DOB: [birth year], 52 y.o.   MRN: [id number]  HPI 52 year old female who  has a past medical history of Allergy, Chicken pox, Hay fever, and Thyroid goiter.  She is a patient of Dr. Ardyth Harps who I am seeing today for an acute issue.  Her symptoms started about 4 days ago when she developed a low-grade fever of 99-100.  She then started to develop body aches especially in her lower extremities, chillls, and malaise.  He denies sinus pressure, rhinorrhea, cough, nausea, vomiting, or diarrhea.  She has been taking Tylenol as a fever reducer and has noticed that her symptoms improved dramatically when taking Tylenol but as soon as the Tylenol wears off and the fever comes back so do her other symptoms.  She has no other sick contacts.   Review of Systems See HPI   Past Medical History:  Diagnosis Date   Allergy    Chicken pox    Hay fever    Thyroid goiter     Social History   Socioeconomic History   Marital status: Married    Spouse name: Not on file   Number of children: Not on file   Years of education: Not on file   Highest education level: Not on file  Occupational History   Not on file  Tobacco Use   Smoking status: Never   Smokeless tobacco: Never  Substance and Sexual Activity   Alcohol use: Not Currently    Comment: social   Drug use: No   Sexual activity: Not on file  Other Topics Concern   Not on file  Social History Narrative   Not on file   Social Determinants of Health   Financial Resource Strain: Not on file  Food Insecurity: Not on file  Transportation Needs: Not on file  Physical Activity: Not on file  Stress: Not on file  Social Connections: Not on file  Intimate Partner Violence: Not on file    Past Surgical History:  Procedure Laterality Date   CESAREAN SECTION      Family History  Problem Relation Age of Onset   Mental illness Mother    Hypertension Father    Diabetes Father     Kidney disease Maternal Grandmother    Hyperlipidemia Maternal Grandfather    Hypertension Paternal Grandmother    Diabetes Paternal Grandmother    Stroke Paternal Grandfather    Hypertension Paternal Grandfather    Diabetes Paternal Grandfather     No Known Allergies  Current Outpatient Medications on File Prior to Visit  Medication Sig Dispense Refill   escitalopram (LEXAPRO) 10 MG tablet Take 1 tablet (10 mg total) by mouth daily. 90 tablet 1   SUMAtriptan (IMITREX) 100 MG tablet Take 1/2 to 1 tablet as needed for migraine headache.  May repeat in 1 hour if needed.  Max 200 mg per 24 hours 10 tablet 6   No current facility-administered medications on file prior to visit.    BP 110/80   Pulse 76   Temp 99.1 F (37.3 C) (Oral)   Ht 5\' 4"  (1.626 m)   Wt 192 lb (87.1 kg)   SpO2 97%   BMI 32.96 kg/m       Objective:   Physical Exam Vitals and nursing note reviewed.  Constitutional:      Appearance: Normal appearance.  HENT:     Right Ear: Tympanic membrane, ear canal and external ear  normal. There is no impacted cerumen.     Left Ear: Tympanic membrane, ear canal and external ear normal. There is no impacted cerumen.     Nose: Nose normal. No congestion or rhinorrhea.     Mouth/Throat:     Mouth: Mucous membranes are moist.     Pharynx: Oropharynx is clear. No oropharyngeal exudate or posterior oropharyngeal erythema.  Cardiovascular:     Rate and Rhythm: Normal rate and regular rhythm.     Pulses: Normal pulses.     Heart sounds: Normal heart sounds.  Pulmonary:     Breath sounds: Normal breath sounds.  Abdominal:     General: Abdomen is flat. Bowel sounds are normal.     Palpations: Abdomen is soft.  Musculoskeletal:        General: Normal range of motion.  Skin:    General: Skin is warm.     Capillary Refill: Capillary refill takes less than 2 seconds.  Neurological:     General: No focal deficit present.     Mental Status: She is alert and oriented to  person, place, and time.  Psychiatric:        Mood and Affect: Mood normal.        Behavior: Behavior normal.        Thought Content: Thought content normal.        Judgment: Judgment normal.        Assessment & Plan:  1. Generalized body aches  - POC COVID-19- Negative  - POC Influenza A/B- Negative   2. Fever, unspecified fever cause  - POC COVID-19 - POC Influenza A/B  3. Viral illness -Symptoms present for 4 days and not relieved by Tylenol.  At this time believe that this is more viral in nature.  Will have her continue to take Tylenol around-the-clock for the next 2 to 3 days and stay well-hydrated.  Rest is much as possible.  Follow-up if symptoms continue past the next 3 to 4 days.  Shirline Frees, NP

## 2023-02-17 ENCOUNTER — Ambulatory Visit (INDEPENDENT_AMBULATORY_CARE_PROVIDER_SITE_OTHER): Payer: Medicaid Other | Admitting: *Deleted

## 2023-02-17 DIAGNOSIS — Z23 Encounter for immunization: Secondary | ICD-10-CM | POA: Diagnosis not present

## 2023-04-28 ENCOUNTER — Ambulatory Visit: Payer: Medicaid Other | Admitting: Internal Medicine

## 2023-04-28 ENCOUNTER — Encounter: Payer: Self-pay | Admitting: Internal Medicine

## 2023-04-28 VITALS — BP 120/78 | HR 81 | Temp 97.9°F | Ht 64.0 in | Wt 198.8 lb

## 2023-04-28 DIAGNOSIS — Z1211 Encounter for screening for malignant neoplasm of colon: Secondary | ICD-10-CM | POA: Diagnosis not present

## 2023-04-28 DIAGNOSIS — Z1231 Encounter for screening mammogram for malignant neoplasm of breast: Secondary | ICD-10-CM

## 2023-04-28 DIAGNOSIS — N951 Menopausal and female climacteric states: Secondary | ICD-10-CM | POA: Diagnosis not present

## 2023-04-28 DIAGNOSIS — Z124 Encounter for screening for malignant neoplasm of cervix: Secondary | ICD-10-CM

## 2023-04-28 MED ORDER — LO LOESTRIN FE 1 MG-10 MCG / 10 MCG PO TABS
1.0000 | ORAL_TABLET | Freq: Every day | ORAL | 11 refills | Status: DC
Start: 2023-04-28 — End: 2023-11-10

## 2023-04-28 NOTE — Progress Notes (Signed)
 Established Patient Office Visit     CC/Reason for Visit: Perimenopausal symptoms, requesting hormone replacement therapy.  HPI: Sara Reynolds is a 52 y.o. female who is coming in today for the above mentioned reasons. Past Medical History is significant for: Depression that is significantly improved on Lexapro .  She is having a lot of symptoms that she correlates with perimenopause including fatigue, weight gain, mood irritability, difficulty sleeping, hot flashes, brain fog.  She is wondering about starting hormone replacement therapy.  She still gets regular monthly cycles.   Past Medical/Surgical History: Past Medical History:  Diagnosis Date   Allergy    Chicken pox    Hay fever    Thyroid  goiter     Past Surgical History:  Procedure Laterality Date   CESAREAN SECTION      Social History:  reports that she has never smoked. She has never used smokeless tobacco. She reports that she does not currently use alcohol. She reports that she does not use drugs.  Allergies: No Known Allergies  Family History:  Family History  Problem Relation Age of Onset   Mental illness Mother    Hypertension Father    Diabetes Father    Kidney disease Maternal Grandmother    Hyperlipidemia Maternal Grandfather    Hypertension Paternal Grandmother    Diabetes Paternal Grandmother    Stroke Paternal Grandfather    Hypertension Paternal Grandfather    Diabetes Paternal Grandfather      Current Outpatient Medications:    cholecalciferol (VITAMIN D3) 25 MCG (1000 UNIT) tablet, Take 1,000 Units by mouth daily., Disp: , Rfl:    co-enzyme Q-10 30 MG capsule, Take 30 mg by mouth 3 (three) times daily., Disp: , Rfl:    escitalopram  (LEXAPRO ) 10 MG tablet, Take 1 tablet (10 mg total) by mouth daily., Disp: 90 tablet, Rfl: 1   liver oil-zinc oxide (DESITIN) 40 % ointment, Apply 1 Application topically as needed for irritation., Disp: , Rfl:    magnesium gluconate (MAGONATE) 500 MG  tablet, Take 500 mg by mouth 2 (two) times daily., Disp: , Rfl:    Multiple Vitamins-Minerals (MULTIVITAMIN WITH MINERALS) tablet, Take 1 tablet by mouth daily., Disp: , Rfl:    Norethindrone-Ethinyl Estradiol-Fe Biphas (LO LOESTRIN FE ) 1 MG-10 MCG / 10 MCG tablet, Take 1 tablet by mouth daily., Disp: 28 tablet, Rfl: 11   Progesterone Micronized (EC-RX PROGESTERONE) 10 % CREA, Place onto the skin., Disp: , Rfl:    SUMAtriptan  (IMITREX ) 100 MG tablet, Take 1/2 to 1 tablet as needed for migraine headache.  May repeat in 1 hour if needed.  Max 200 mg per 24 hours, Disp: 10 tablet, Rfl: 6  Review of Systems:  Negative unless indicated in HPI.   Physical Exam: Vitals:   04/28/23 0903  BP: 120/78  Pulse: 81  Temp: 97.9 F (36.6 C)  TempSrc: Oral  SpO2: 97%  Weight: 198 lb 12.8 oz (90.2 kg)  Height: 5' 4 (1.626 m)    Body mass index is 34.12 kg/m.   Physical Exam Vitals reviewed.  Constitutional:      Appearance: Normal appearance.  HENT:     Head: Normocephalic and atraumatic.  Eyes:     Conjunctiva/sclera: Conjunctivae normal.  Skin:    General: Skin is warm and dry.  Neurological:     General: No focal deficit present.     Mental Status: She is alert and oriented to person, place, and time.  Psychiatric:  Mood and Affect: Mood normal.        Behavior: Behavior normal.        Thought Content: Thought content normal.        Judgment: Judgment normal.      Impression and Plan:  Perimenopause -     Lo Loestrin Fe ; Take 1 tablet by mouth daily.  Dispense: 28 tablet; Refill: 11  Screen for colon cancer -     Cologuard  Screening mammogram for breast cancer -     3D Screening Mammogram, Left and Right; Future  Screening for cervical cancer -     Ambulatory referral to Gynecology   -Orders placed for GYN evaluation, Cologuard and mammogram for screening purposes. -With all her perimenopausal symptoms I will go ahead and put her on low Loestrin .  She still  has monthly cycles so we will hold off on bioidentical hormones for now.  Time spent:31 minutes reviewing chart, interviewing and examining patient and formulating plan of care.     Tully Theophilus Andrews, MD [facility] at [facility]

## 2023-05-15 ENCOUNTER — Ambulatory Visit
Admission: RE | Admit: 2023-05-15 | Discharge: 2023-05-15 | Disposition: A | Discharge status: Home / Self Care | Payer: Medicaid Other | Source: Ambulatory Visit | Attending: Internal Medicine | Admitting: Internal Medicine

## 2023-05-15 DIAGNOSIS — Z1231 Encounter for screening mammogram for malignant neoplasm of breast: Secondary | ICD-10-CM | POA: Diagnosis not present

## 2023-05-19 ENCOUNTER — Other Ambulatory Visit: Payer: Self-pay | Admitting: Internal Medicine

## 2023-05-19 DIAGNOSIS — R928 Other abnormal and inconclusive findings on diagnostic imaging of breast: Secondary | ICD-10-CM

## 2023-06-01 ENCOUNTER — Ambulatory Visit
Admission: RE | Admit: 2023-06-01 | Discharge: 2023-06-01 | Disposition: A | Discharge status: Home / Self Care | Payer: Medicaid Other | Source: Ambulatory Visit | Attending: Internal Medicine | Admitting: Internal Medicine

## 2023-06-01 ENCOUNTER — Encounter: Payer: Self-pay | Admitting: Internal Medicine

## 2023-06-01 ENCOUNTER — Other Ambulatory Visit: Payer: Self-pay | Admitting: Internal Medicine

## 2023-06-01 DIAGNOSIS — R928 Other abnormal and inconclusive findings on diagnostic imaging of breast: Secondary | ICD-10-CM

## 2023-06-01 DIAGNOSIS — N6314 Unspecified lump in the right breast, lower inner quadrant: Secondary | ICD-10-CM | POA: Diagnosis not present

## 2023-06-01 DIAGNOSIS — N6310 Unspecified lump in the right breast, unspecified quadrant: Secondary | ICD-10-CM

## 2023-06-04 LAB — COLOGUARD: COLOGUARD: NEGATIVE

## 2023-06-08 ENCOUNTER — Encounter: Payer: Self-pay | Admitting: Internal Medicine

## 2023-06-08 ENCOUNTER — Ambulatory Visit
Admission: RE | Admit: 2023-06-08 | Discharge: 2023-06-08 | Disposition: A | Discharge status: Home / Self Care | Payer: Medicaid Other | Source: Ambulatory Visit | Attending: Internal Medicine | Admitting: Internal Medicine

## 2023-06-08 DIAGNOSIS — R928 Other abnormal and inconclusive findings on diagnostic imaging of breast: Secondary | ICD-10-CM

## 2023-06-08 DIAGNOSIS — N6314 Unspecified lump in the right breast, lower inner quadrant: Secondary | ICD-10-CM | POA: Diagnosis not present

## 2023-06-08 DIAGNOSIS — N6011 Diffuse cystic mastopathy of right breast: Secondary | ICD-10-CM | POA: Diagnosis not present

## 2023-06-08 DIAGNOSIS — N6310 Unspecified lump in the right breast, unspecified quadrant: Secondary | ICD-10-CM

## 2023-06-08 HISTORY — PX: BREAST BIOPSY: SHX20

## 2023-06-09 LAB — SURGICAL PATHOLOGY

## 2023-07-09 ENCOUNTER — Other Ambulatory Visit: Payer: Self-pay | Admitting: Internal Medicine

## 2023-07-09 DIAGNOSIS — F331 Major depressive disorder, recurrent, moderate: Secondary | ICD-10-CM

## 2023-07-23 ENCOUNTER — Other Ambulatory Visit: Payer: Self-pay | Admitting: Internal Medicine

## 2023-07-23 DIAGNOSIS — F331 Major depressive disorder, recurrent, moderate: Secondary | ICD-10-CM

## 2023-10-31 ENCOUNTER — Other Ambulatory Visit: Payer: Self-pay | Admitting: Internal Medicine

## 2023-10-31 DIAGNOSIS — G43829 Menstrual migraine, not intractable, without status migrainosus: Secondary | ICD-10-CM

## 2023-11-02 ENCOUNTER — Other Ambulatory Visit: Payer: Self-pay | Admitting: Internal Medicine

## 2023-11-02 DIAGNOSIS — F331 Major depressive disorder, recurrent, moderate: Secondary | ICD-10-CM

## 2023-11-02 NOTE — Telephone Encounter (Unsigned)
 Copied from CRM [id number]. Topic: Clinical - Medication Refill >> Nov 02, 2023  3:23 PM Viola F wrote: Medication: escitalopram  (LEXAPRO ) 10 MG tablet [[id number]]  Has the patient contacted their pharmacy? Yes (Agent: If no, request that the patient contact the pharmacy for the refill. If patient does not wish to contact the pharmacy document the reason why and proceed with request.) (Agent: If yes, when and what did the pharmacy advise?)  This is the patient's preferred pharmacy:  [id number] [place], [place] - [address] [place] [address] Phone: [phone] Fax: [phone]  Is this the correct pharmacy for this prescription? Yes If no, delete pharmacy and type the correct one.   Has the prescription been filled recently? Yes  Is the patient out of the medication? Yes  Has the patient been seen for an appointment in the last year OR does the patient have an upcoming appointment? Yes  Can we respond through MyChart? No  Agent: Please be advised that Rx refills may take up to 3 business days. We ask that you follow-up with your pharmacy.

## 2023-11-03 MED ORDER — ESCITALOPRAM OXALATE 10 MG PO TABS: 10.0000 mg | ORAL_TABLET | Freq: Every day | ORAL | 0 refills | Status: DC

## 2023-11-10 ENCOUNTER — Ambulatory Visit: Admitting: Internal Medicine

## 2023-11-10 VITALS — BP 126/82 | HR 73 | Temp 97.7°F | Wt 196.0 lb

## 2023-11-10 DIAGNOSIS — R7303 Prediabetes: Secondary | ICD-10-CM | POA: Diagnosis not present

## 2023-11-10 DIAGNOSIS — F331 Major depressive disorder, recurrent, moderate: Secondary | ICD-10-CM | POA: Diagnosis not present

## 2023-11-10 DIAGNOSIS — L989 Disorder of the skin and subcutaneous tissue, unspecified: Secondary | ICD-10-CM

## 2023-11-10 LAB — CBC WITH DIFFERENTIAL/PLATELET
Basophils Absolute: 0 K/uL (ref 0.0–0.1)
Basophils Relative: 0.7 % (ref 0.0–3.0)
Eosinophils Absolute: 0 K/uL (ref 0.0–0.7)
Eosinophils Relative: 0.7 % (ref 0.0–5.0)
HCT: 43.2 % (ref 36.0–46.0)
Hemoglobin: 14 g/dL (ref 12.0–15.0)
Lymphocytes Relative: 27.9 % (ref 12.0–46.0)
Lymphs Abs: 1.4 K/uL (ref 0.7–4.0)
MCHC: 32.4 g/dL (ref 30.0–36.0)
MCV: 82 fl (ref 78.0–100.0)
Monocytes Absolute: 0.4 K/uL (ref 0.1–1.0)
Monocytes Relative: 7.5 % (ref 3.0–12.0)
Neutro Abs: 3.1 K/uL (ref 1.4–7.7)
Neutrophils Relative %: 63.2 % (ref 43.0–77.0)
Platelets: 236 K/uL (ref 150.0–400.0)
RBC: 5.27 Mil/uL — ABNORMAL HIGH (ref 3.87–5.11)
RDW: 15.8 % — ABNORMAL HIGH (ref 11.5–15.5)
WBC: 5 K/uL (ref 4.0–10.5)

## 2023-11-10 LAB — COMPREHENSIVE METABOLIC PANEL WITH GFR
ALT: 14 U/L (ref 0–35)
AST: 19 U/L (ref 0–37)
Albumin: 4.4 g/dL (ref 3.5–5.2)
Alkaline Phosphatase: 81 U/L (ref 39–117)
BUN: 19 mg/dL (ref 6–23)
CO2: 26 meq/L (ref 19–32)
Calcium: 9.4 mg/dL (ref 8.4–10.5)
Chloride: 103 meq/L (ref 96–112)
Creatinine, Ser: 0.83 mg/dL (ref 0.40–1.20)
GFR: 81.16 mL/min (ref >60.00)
Glucose, Bld: 95 mg/dL (ref 70–99)
Potassium: 4 meq/L (ref 3.5–5.1)
Sodium: 136 meq/L (ref 135–145)
Total Bilirubin: 0.3 mg/dL (ref 0.2–1.2)
Total Protein: 7.4 g/dL (ref 6.0–8.3)

## 2023-11-10 LAB — LIPID PANEL
Cholesterol: 184 mg/dL (ref 0–200)
HDL: 57.7 mg/dL (ref >39.00)
LDL Cholesterol: 102 mg/dL — ABNORMAL HIGH (ref 0–99)
NonHDL: 126.2
Total CHOL/HDL Ratio: 3
Triglycerides: 120 mg/dL (ref 0.0–149.0)
VLDL: 24 mg/dL (ref 0.0–40.0)

## 2023-11-10 LAB — POCT GLYCOSYLATED HEMOGLOBIN (HGB A1C): Hemoglobin A1C: 5.7 % — AB (ref 4.0–5.6)

## 2023-11-10 NOTE — Progress Notes (Signed)
 Established Patient Office Visit     CC/Reason for Visit: Follow-up chronic conditions  HPI: Sara Reynolds is a 53 y.o. female who is coming in today for the above mentioned reasons. Past Medical History is significant for: Impaired glucose tolerance, depression, obesity.  She is feeling well without acute concerns or complaints.  There is a small, rough patch of skin over her left cheek for which she is requesting a dermatology referral.   Past Medical/Surgical History: Past Medical History:  Diagnosis Date   Allergy    Chicken pox    Hay fever    Thyroid  goiter     Past Surgical History:  Procedure Laterality Date   BREAST BIOPSY Right 06/08/2023   US  RT BREAST BX W LOC DEV 1ST LESION IMG BX SPEC US  GUIDE 06/08/2023 [facility]AMMO[facility]   CESAREAN SECTION      Social History:  reports that she has never smoked. She has never used smokeless tobacco. She reports that she does not currently use alcohol. She reports that she does not use drugs.  Allergies: No Known Allergies  Family History:  Family History  Problem Relation Age of Onset   Mental illness Mother    Hypertension Father    Diabetes Father    Kidney disease Maternal Grandmother    Hyperlipidemia Maternal Grandfather    Hypertension Paternal Grandmother    Diabetes Paternal Grandmother    Stroke Paternal Grandfather    Hypertension Paternal Grandfather    Diabetes Paternal Grandfather    BRCA 1/2 Neg Hx    Breast cancer Neg Hx      Current Outpatient Medications:    cholecalciferol (VITAMIN D3) 25 MCG (1000 UNIT) tablet, Take 1,000 Units by mouth daily., Disp: , Rfl:    co-enzyme Q-10 30 MG capsule, Take 30 mg by mouth 3 (three) times daily., Disp: , Rfl:    escitalopram  (LEXAPRO ) 10 MG tablet, Take 1 tablet (10 mg total) by mouth daily., Disp: 90 tablet, Rfl: 0   magnesium gluconate (MAGONATE) 500 MG tablet, Take 500 mg by mouth 2 (two) times daily., Disp: , Rfl:    Multiple Vitamins-Minerals  (MULTIVITAMIN WITH MINERALS) tablet, Take 1 tablet by mouth daily., Disp: , Rfl:    SUMAtriptan  (IMITREX ) 100 MG tablet, TAKE 1/2 TO 1 TABLET AS NEEDED FOR MIGRAINE HEADACHE. MAY REPEAT IN 1 HOUR IF NEEDED. MAX 200 MG PER 24 HOURS, Disp: 10 tablet, Rfl: 6   Progesterone Micronized (EC-RX PROGESTERONE) 10 % CREA, Place onto the skin., Disp: , Rfl:   Review of Systems:  Negative unless indicated in HPI.   Physical Exam: Vitals:   11/10/23 0924 11/10/23 0928  BP: (!) 140/98 126/82  Pulse: 73   Temp: 97.7 F (36.5 C)   TempSrc: Oral   SpO2: 99%   Weight: 196 lb (88.9 kg)     Body mass index is 33.64 kg/m.   Physical Exam Vitals reviewed.  Constitutional:      Appearance: Normal appearance.  HENT:     Head: Normocephalic and atraumatic.  Eyes:     Conjunctiva/sclera: Conjunctivae normal.  Cardiovascular:     Rate and Rhythm: Normal rate and regular rhythm.  Pulmonary:     Effort: Pulmonary effort is normal.     Breath sounds: Normal breath sounds.  Skin:    General: Skin is warm and dry.  Neurological:     General: No focal deficit present.     Mental Status: She is alert and oriented to  person, place, and time.  Psychiatric:        Mood and Affect: Mood normal.        Behavior: Behavior normal.        Thought Content: Thought content normal.        Judgment: Judgment normal.       Impression and Plan:  Prediabetes -     POCT glycosylated hemoglobin (Hb A1C) -     CBC with Differential/Platelet; Future -     Comprehensive metabolic panel with GFR; Future -     Lipid panel; Future  Moderate episode of recurrent major depressive disorder (HCC)  Lesion of skin of cheek -     Ambulatory referral to Dermatology   - A1c is stable in the prediabetic range at 5.7. - Check labs today. - Refer to dermatology for cheek lesion. - Mood is stable.  Time spent:30 minutes reviewing chart, interviewing and examining patient and formulating plan of care.     Tully Theophilus Andrews, MD [facility] at [facility]

## 2023-11-11 ENCOUNTER — Ambulatory Visit: Payer: Self-pay | Admitting: Internal Medicine

## 2024-01-04 ENCOUNTER — Telehealth: Payer: Self-pay | Admitting: *Deleted

## 2024-01-04 DIAGNOSIS — G43829 Menstrual migraine, not intractable, without status migrainosus: Secondary | ICD-10-CM

## 2024-01-04 MED ORDER — SUMATRIPTAN SUCCINATE 100 MG PO TABS: ORAL_TABLET | ORAL | 0 refills | Status: AC

## 2024-01-04 NOTE — Telephone Encounter (Signed)
 Copied from CRM [id number]. Topic: Clinical - Medication Question >> Jan 01, 2024  8:05 AM Burnard DEL wrote: Reason for CRM: [place] and [place] headache medication SUMAtriptan  (IMITREX ) 100 MG tablet at home mistakenly. She would like to know if she could have a few [place]?  [facility] [address] [place], [place] - [address] AT [place] [address]  Phone: [phone] Fax: [phone]

## 2024-01-04 NOTE — Telephone Encounter (Signed)
 Refill sent.

## 2024-05-01 ENCOUNTER — Other Ambulatory Visit: Payer: Self-pay | Admitting: Internal Medicine

## 2024-05-01 DIAGNOSIS — F331 Major depressive disorder, recurrent, moderate: Secondary | ICD-10-CM
